# Patient Record
Sex: Female | Born: 1955 | Race: White | Hispanic: No | Marital: Married | Smoking: Former smoker
Health system: Southern US, Community
[De-identification: ages and names within clinical notes are randomized; demographics above are authoritative.]

## PROBLEM LIST (undated history)

## (undated) DIAGNOSIS — J9610 Chronic respiratory failure, unspecified whether with hypoxia or hypercapnia: Secondary | ICD-10-CM

## (undated) DIAGNOSIS — J189 Pneumonia, unspecified organism: Secondary | ICD-10-CM

## (undated) DIAGNOSIS — E559 Vitamin D deficiency, unspecified: Secondary | ICD-10-CM

## (undated) DIAGNOSIS — E785 Hyperlipidemia, unspecified: Secondary | ICD-10-CM

## (undated) DIAGNOSIS — C801 Malignant (primary) neoplasm, unspecified: Secondary | ICD-10-CM

## (undated) DIAGNOSIS — D696 Thrombocytopenia, unspecified: Secondary | ICD-10-CM

## (undated) DIAGNOSIS — R490 Dysphonia: Secondary | ICD-10-CM

## (undated) DIAGNOSIS — J449 Chronic obstructive pulmonary disease, unspecified: Secondary | ICD-10-CM

## (undated) DIAGNOSIS — J42 Unspecified chronic bronchitis: Secondary | ICD-10-CM

## (undated) DIAGNOSIS — K7460 Unspecified cirrhosis of liver: Secondary | ICD-10-CM

## (undated) DIAGNOSIS — J383 Other diseases of vocal cords: Secondary | ICD-10-CM

## (undated) DIAGNOSIS — R0600 Dyspnea, unspecified: Secondary | ICD-10-CM

## (undated) HISTORY — PX: HAMMER TOE SURGERY: SHX385

## (undated) HISTORY — DX: Other diseases of vocal cords: J38.3

## (undated) HISTORY — PX: WISDOM TOOTH EXTRACTION: SHX21

## (undated) HISTORY — DX: Hyperlipidemia, unspecified: E78.5

## (undated) HISTORY — DX: Dysphonia: R49.0

## (undated) HISTORY — DX: Thrombocytopenia, unspecified: D69.6

## (undated) HISTORY — DX: Chronic respiratory failure, unspecified whether with hypoxia or hypercapnia: J96.10

## (undated) HISTORY — DX: Vitamin D deficiency, unspecified: E55.9

## (undated) HISTORY — DX: Unspecified chronic bronchitis: J42

## (undated) HISTORY — DX: Chronic obstructive pulmonary disease, unspecified: J44.9

---

## 1983-11-29 HISTORY — PX: TUBAL LIGATION: SHX77

## 2007-10-15 ENCOUNTER — Encounter: Admission: RE | Admit: 2007-10-15 | Discharge: 2007-10-15 | Payer: Self-pay | Admitting: Family Medicine

## 2007-10-24 DIAGNOSIS — I872 Venous insufficiency (chronic) (peripheral): Secondary | ICD-10-CM | POA: Insufficient documentation

## 2007-11-28 ENCOUNTER — Encounter: Admission: RE | Admit: 2007-11-28 | Discharge: 2007-11-28 | Payer: Self-pay | Admitting: Family Medicine

## 2007-12-05 ENCOUNTER — Encounter: Admission: RE | Admit: 2007-12-05 | Discharge: 2007-12-05 | Payer: Self-pay | Admitting: Family Medicine

## 2008-12-24 ENCOUNTER — Encounter: Admission: RE | Admit: 2008-12-24 | Discharge: 2008-12-24 | Payer: Self-pay | Admitting: Family Medicine

## 2009-06-16 ENCOUNTER — Ambulatory Visit: Payer: Self-pay | Admitting: Internal Medicine

## 2009-06-23 LAB — LACTATE DEHYDROGENASE: LDH: 191 U/L (ref 94–250)

## 2009-06-23 LAB — COMPREHENSIVE METABOLIC PANEL
ALT: 15 U/L (ref 0–35)
AST: 20 U/L (ref 0–37)
BUN: 10 mg/dL (ref 6–23)
CO2: 29 mEq/L (ref 19–32)
Calcium: 9.5 mg/dL (ref 8.4–10.5)
Chloride: 103 mEq/L (ref 96–112)
Creatinine, Ser: 1.01 mg/dL (ref 0.40–1.20)
Total Bilirubin: 1.2 mg/dL (ref 0.3–1.2)

## 2009-06-23 LAB — CBC WITH DIFFERENTIAL/PLATELET
BASO%: 0.8 % (ref 0.0–2.0)
Basophils Absolute: 0 10*3/uL (ref 0.0–0.1)
EOS%: 2.3 % (ref 0.0–7.0)
HCT: 39.8 % (ref 34.8–46.6)
HGB: 13.5 g/dL (ref 11.6–15.9)
LYMPH%: 35.1 % (ref 14.0–49.7)
MCH: 34.1 pg — ABNORMAL HIGH (ref 25.1–34.0)
MCHC: 33.9 g/dL (ref 31.5–36.0)
NEUT%: 56.6 % (ref 38.4–76.8)
Platelets: 108 10*3/uL — ABNORMAL LOW (ref 145–400)

## 2009-08-24 ENCOUNTER — Ambulatory Visit: Payer: Self-pay | Admitting: Internal Medicine

## 2009-08-26 LAB — FERRITIN: Ferritin: 36 ng/mL (ref 10–291)

## 2009-08-26 LAB — CBC WITH DIFFERENTIAL/PLATELET
Basophils Absolute: 0 10*3/uL (ref 0.0–0.1)
Eosinophils Absolute: 0 10*3/uL (ref 0.0–0.5)
MCHC: 35.2 g/dL (ref 31.5–36.0)
MONO#: 0.3 10*3/uL (ref 0.1–0.9)
NEUT#: 1.8 10*3/uL (ref 1.5–6.5)
RBC: 3.91 10*6/uL (ref 3.70–5.45)
RDW: 13 % (ref 11.2–14.5)
WBC: 3.1 10*3/uL — ABNORMAL LOW (ref 3.9–10.3)

## 2009-08-26 LAB — IRON AND TIBC
%SAT: 27 % (ref 20–55)
Iron: 94 ug/dL (ref 42–145)
UIBC: 256 ug/dL

## 2009-08-26 LAB — VITAMIN B12: Vitamin B-12: 243 pg/mL (ref 211–911)

## 2009-09-04 ENCOUNTER — Ambulatory Visit (HOSPITAL_COMMUNITY): Admission: RE | Admit: 2009-09-04 | Discharge: 2009-09-04 | Payer: Self-pay | Admitting: Internal Medicine

## 2009-09-04 ENCOUNTER — Ambulatory Visit: Payer: Self-pay | Admitting: Internal Medicine

## 2009-09-04 ENCOUNTER — Encounter: Payer: Self-pay | Admitting: Internal Medicine

## 2009-09-09 LAB — CBC WITH DIFFERENTIAL/PLATELET
Basophils Absolute: 0 10*3/uL (ref 0.0–0.1)
EOS%: 2.7 % (ref 0.0–7.0)
LYMPH%: 38.2 % (ref 14.0–49.7)
MONO#: 0.3 10*3/uL (ref 0.1–0.9)
MONO%: 6.4 % (ref 0.0–14.0)
NEUT#: 2.1 10*3/uL (ref 1.5–6.5)
NEUT%: 51.8 % (ref 38.4–76.8)
Platelets: 137 10*3/uL — ABNORMAL LOW (ref 145–400)
RDW: 13.3 % (ref 11.2–14.5)
WBC: 4 10*3/uL (ref 3.9–10.3)
lymph#: 1.5 10*3/uL (ref 0.9–3.3)

## 2009-12-07 ENCOUNTER — Ambulatory Visit: Payer: Self-pay | Admitting: Internal Medicine

## 2009-12-09 LAB — CBC WITH DIFFERENTIAL/PLATELET
BASO%: 0.9 % (ref 0.0–2.0)
Basophils Absolute: 0 10*3/uL (ref 0.0–0.1)
LYMPH%: 37.3 % (ref 14.0–49.7)
MCV: 101.6 fL — ABNORMAL HIGH (ref 79.5–101.0)
MONO#: 0.3 10*3/uL (ref 0.1–0.9)
MONO%: 7.1 % (ref 0.0–14.0)
NEUT#: 2.1 10*3/uL (ref 1.5–6.5)
RBC: 4.15 10*6/uL (ref 3.70–5.45)
lymph#: 1.5 10*3/uL (ref 0.9–3.3)

## 2010-01-20 ENCOUNTER — Encounter: Admission: RE | Admit: 2010-01-20 | Discharge: 2010-01-20 | Payer: Self-pay | Admitting: Family Medicine

## 2011-01-20 ENCOUNTER — Other Ambulatory Visit: Payer: Self-pay | Admitting: Family Medicine

## 2011-01-20 DIAGNOSIS — Z1231 Encounter for screening mammogram for malignant neoplasm of breast: Secondary | ICD-10-CM

## 2011-01-20 DIAGNOSIS — N959 Unspecified menopausal and perimenopausal disorder: Secondary | ICD-10-CM

## 2011-01-28 ENCOUNTER — Other Ambulatory Visit: Payer: Self-pay

## 2011-01-28 ENCOUNTER — Ambulatory Visit: Payer: Self-pay

## 2011-02-02 ENCOUNTER — Ambulatory Visit
Admission: RE | Admit: 2011-02-02 | Discharge: 2011-02-02 | Disposition: A | Discharge status: Home / Self Care | Payer: 59 | Source: Ambulatory Visit | Attending: Family Medicine | Admitting: Family Medicine

## 2011-02-02 DIAGNOSIS — Z1231 Encounter for screening mammogram for malignant neoplasm of breast: Secondary | ICD-10-CM

## 2011-02-02 DIAGNOSIS — N959 Unspecified menopausal and perimenopausal disorder: Secondary | ICD-10-CM

## 2011-02-04 ENCOUNTER — Other Ambulatory Visit: Payer: Self-pay | Admitting: Family Medicine

## 2011-02-04 DIAGNOSIS — R928 Other abnormal and inconclusive findings on diagnostic imaging of breast: Secondary | ICD-10-CM

## 2011-02-16 ENCOUNTER — Ambulatory Visit
Admission: RE | Admit: 2011-02-16 | Discharge: 2011-02-16 | Disposition: A | Discharge status: Home / Self Care | Payer: 59 | Source: Ambulatory Visit | Attending: Family Medicine | Admitting: Family Medicine

## 2011-02-16 DIAGNOSIS — R928 Other abnormal and inconclusive findings on diagnostic imaging of breast: Secondary | ICD-10-CM

## 2011-03-03 LAB — DIFFERENTIAL
Basophils Absolute: 0 10*3/uL (ref 0.0–0.1)
Monocytes Relative: 7 % (ref 3–12)
Neutro Abs: 1.8 10*3/uL (ref 1.7–7.7)

## 2011-03-03 LAB — CBC
Hemoglobin: 13.6 g/dL (ref 12.0–15.0)
MCHC: 34 g/dL (ref 30.0–36.0)
RDW: 12.3 % (ref 11.5–15.5)

## 2011-03-03 LAB — CHROMOSOME ANALYSIS, BONE MARROW

## 2011-08-17 DIAGNOSIS — D696 Thrombocytopenia, unspecified: Secondary | ICD-10-CM | POA: Insufficient documentation

## 2011-08-17 DIAGNOSIS — L84 Corns and callosities: Secondary | ICD-10-CM | POA: Insufficient documentation

## 2014-05-29 LAB — PULMONARY FUNCTION TEST

## 2014-07-28 ENCOUNTER — Encounter: Payer: Self-pay | Admitting: Pulmonary Disease

## 2014-07-28 ENCOUNTER — Ambulatory Visit (INDEPENDENT_AMBULATORY_CARE_PROVIDER_SITE_OTHER): Payer: 59 | Admitting: Pulmonary Disease

## 2014-07-28 VITALS — BP 124/72 | HR 104 | Ht 69.0 in | Wt 161.6 lb

## 2014-07-28 DIAGNOSIS — J432 Centrilobular emphysema: Secondary | ICD-10-CM

## 2014-07-28 DIAGNOSIS — J449 Chronic obstructive pulmonary disease, unspecified: Secondary | ICD-10-CM | POA: Insufficient documentation

## 2014-07-28 DIAGNOSIS — J438 Other emphysema: Secondary | ICD-10-CM

## 2014-07-28 DIAGNOSIS — R918 Other nonspecific abnormal finding of lung field: Secondary | ICD-10-CM | POA: Insufficient documentation

## 2014-07-28 DIAGNOSIS — R911 Solitary pulmonary nodule: Secondary | ICD-10-CM

## 2014-07-28 MED ORDER — NICOTINE 10 MG IN INHA: 1.0000 | RESPIRATORY_TRACT | Status: DC | PRN

## 2014-07-28 NOTE — Assessment & Plan Note (Signed)
PET scan will be scheduled
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
## 2014-07-28 NOTE — Patient Instructions (Signed)
You have severe COPD You will qualify for pulmonary rehab PET scan will be scheduled  Trial of nicotrol inhaler

## 2014-07-28 NOTE — Assessment & Plan Note (Addendum)
You have severe COPD You will qualify for pulmonary rehab Trial of nicotrol inhaler - smoking cessation paramount Alternatives including medications were discussed

## 2014-08-07 ENCOUNTER — Ambulatory Visit (HOSPITAL_COMMUNITY): Payer: 59

## 2014-08-21 ENCOUNTER — Ambulatory Visit (HOSPITAL_COMMUNITY)
Admission: RE | Admit: 2014-08-21 | Discharge: 2014-08-21 | Disposition: A | Discharge status: Home / Self Care | Payer: 59 | Source: Ambulatory Visit | Attending: Pulmonary Disease | Admitting: Pulmonary Disease

## 2014-08-21 ENCOUNTER — Other Ambulatory Visit: Payer: Self-pay | Admitting: Pulmonary Disease

## 2014-08-21 DIAGNOSIS — K8020 Calculus of gallbladder without cholecystitis without obstruction: Secondary | ICD-10-CM | POA: Diagnosis not present

## 2014-08-21 DIAGNOSIS — R911 Solitary pulmonary nodule: Secondary | ICD-10-CM | POA: Diagnosis present

## 2014-08-21 DIAGNOSIS — K7460 Unspecified cirrhosis of liver: Secondary | ICD-10-CM | POA: Insufficient documentation

## 2014-08-21 LAB — GLUCOSE, CAPILLARY: Glucose-Capillary: 83 mg/dL (ref 70–99)

## 2014-08-21 MED ORDER — FLUDEOXYGLUCOSE F - 18 (FDG) INJECTION
8.0400 | Freq: Once | INTRAVENOUS | Status: AC | PRN
  Administered 2014-08-21: 8.04 via INTRAVENOUS

## 2014-08-27 ENCOUNTER — Other Ambulatory Visit: Payer: Self-pay | Admitting: Pulmonary Disease

## 2014-08-28 NOTE — Telephone Encounter (Signed)
Pt last had refill on nicotrol inhaler 07/28/14 Last OV 07/28/14 Pending OV 10/27/14 Please advise RA thanks

## 2014-08-29 NOTE — Telephone Encounter (Signed)
Ok to refill 

## 2014-09-15 ENCOUNTER — Telehealth (HOSPITAL_COMMUNITY): Payer: Self-pay

## 2014-09-15 NOTE — Telephone Encounter (Signed)
I have called and left a message with Toni Moore to inquire about participation in Pulmonary Rehab. Patient stated on 09/12/14 that she would call insurance to verify coverage.

## 2014-09-19 ENCOUNTER — Telehealth (HOSPITAL_COMMUNITY): Payer: Self-pay

## 2014-09-19 NOTE — Telephone Encounter (Signed)
I have called and left a message with [place] to inquire about participation in Pulmonary Rehab. Will send letter in mail and follow up.

## 2014-10-03 ENCOUNTER — Encounter (HOSPITAL_COMMUNITY): Payer: Self-pay

## 2014-10-03 ENCOUNTER — Encounter (HOSPITAL_COMMUNITY)
Admission: RE | Admit: 2014-10-03 | Discharge: 2014-10-03 | Disposition: A | Discharge status: Home / Self Care | Payer: 59 | Source: Ambulatory Visit | Attending: Pulmonary Disease | Admitting: Pulmonary Disease

## 2014-10-03 VITALS — BP 132/72 | HR 87 | Resp 18 | Ht 69.25 in | Wt 170.9 lb

## 2014-10-03 DIAGNOSIS — Z23 Encounter for immunization: Secondary | ICD-10-CM | POA: Diagnosis not present

## 2014-10-03 DIAGNOSIS — R911 Solitary pulmonary nodule: Secondary | ICD-10-CM | POA: Insufficient documentation

## 2014-10-03 DIAGNOSIS — Z7951 Long term (current) use of inhaled steroids: Secondary | ICD-10-CM | POA: Insufficient documentation

## 2014-10-03 DIAGNOSIS — J439 Emphysema, unspecified: Secondary | ICD-10-CM | POA: Diagnosis present

## 2014-10-03 DIAGNOSIS — Z951 Presence of aortocoronary bypass graft: Secondary | ICD-10-CM | POA: Insufficient documentation

## 2014-10-03 DIAGNOSIS — Z87891 Personal history of nicotine dependence: Secondary | ICD-10-CM | POA: Diagnosis not present

## 2014-10-03 DIAGNOSIS — J449 Chronic obstructive pulmonary disease, unspecified: Secondary | ICD-10-CM | POA: Insufficient documentation

## 2014-10-03 NOTE — Progress Notes (Signed)
Toni Moore 58 y.o. female Pulmonary Rehab Orientation Note Patient arrived today in [facility] for orientation to Pulmonary Rehab. She ambulated from [employer] with little difficulty. She stated she took her own pace and did not have to stop for a rest break. She does not carry portable oxygen. Per pt, she uses oxygen never. Color good, skin warm and dry. Patient is oriented to time and place. Patient's medical history and medications reviewed. Heart rate is normal, breath sounds clear to auscultation, no wheezes, rales, or rhonchi, diminished in the bases. Grip strength equal, strong. Distal pulses palpable. Patient reports she does take medications as prescribed. Patient states she follows a Regular diet. The patient reports no specific efforts to gain or lose weight. She stated she has gained approximately 10 lbs since quitting smoking 2 months ago. We discussed using sugar free candy instead of food when she is wanting to smoke. She is also using nicotrol as needed.Patient's weight will be monitored closely. Demonstration and practice of PLB using pulse oximeter. Patient able to return demonstration satisfactorily. Safety and hand hygiene in the exercise area reviewed with patient. Patient voices understanding of the information reviewed. Department expectations discussed with patient and achievable goals were set. The patient shows enthusiasm about attending the program and we look forward to working with this nice lady. The patient is scheduled for a 6 min walk test on Thursday 11/12 and to begin exercise on Tuesday 11/17 at 1030.   45 minutes was spent on a variety of activities such as assessment of the patient, obtaining baseline data including height, weight, BMI, and grip strength, verifying medical history, allergies, and current medications, and teaching patient strategies for performing tasks with less respiratory effort with emphasis on pursed lip breathing.

## 2014-10-09 ENCOUNTER — Encounter (HOSPITAL_COMMUNITY)
Admission: RE | Admit: 2014-10-09 | Discharge: 2014-10-09 | Disposition: A | Discharge status: Home / Self Care | Payer: 59 | Source: Ambulatory Visit | Attending: Pulmonary Disease | Admitting: Pulmonary Disease

## 2014-10-09 DIAGNOSIS — J439 Emphysema, unspecified: Secondary | ICD-10-CM | POA: Diagnosis not present

## 2014-10-09 NOTE — Progress Notes (Signed)
Toni Moore completed a Six-Minute Walk Test on 10/09/14 . Toni Moore walked 818 feet with 2 breaks.  The patient's lowest oxygen saturation was 85% , highest heart rate was 111 , and highest blood pressure was 144/80. The patient was on room air to start with but when her oxygen saturation dropped to 85% I placed her on 2 liters of oxygen with a nasal cannula. [name] stated that nothing hindered their walk test.

## 2014-10-13 ENCOUNTER — Telehealth: Payer: Self-pay | Admitting: Pulmonary Disease

## 2014-10-13 DIAGNOSIS — J432 Centrilobular emphysema: Secondary | ICD-10-CM

## 2014-10-13 NOTE — Telephone Encounter (Signed)
lmomtcb x1 

## 2014-10-13 NOTE — Telephone Encounter (Signed)
Called and spoke to [place] at Pulmonary rehab. Cloyde Reams stated pt will at least need 2lpm O2 with exertion at home. Six minute walk is in Epic in note from 11/12. Cloyde Reams stated pt will be at pulmonary rehab on 11/17 and will have more data regarding pt's O2 and will fax or call with results.   RA please advise.

## 2014-10-13 NOTE — Telephone Encounter (Signed)
OK to write for oxygen 2 L while ambulating and sleep Please confirm that patient is okay with this

## 2014-10-14 ENCOUNTER — Encounter (HOSPITAL_COMMUNITY)
Admission: RE | Admit: 2014-10-14 | Discharge: 2014-10-14 | Disposition: A | Discharge status: Home / Self Care | Payer: 59 | Source: Ambulatory Visit | Attending: Pulmonary Disease | Admitting: Pulmonary Disease

## 2014-10-14 DIAGNOSIS — J439 Emphysema, unspecified: Secondary | ICD-10-CM | POA: Diagnosis not present

## 2014-10-14 NOTE — Progress Notes (Signed)
Today, Crystalee exercised at [facility]. Service time was from 10:30am to 12:15pm.  The patient exercised for more than 31 minutes performing aerobic, strengthening, and stretching exercises. Oxygen saturation, heart rate, blood pressure, rate of perceived exertion, and shortness of breath were all monitored before, during, and after exercise. Likisha presented with no problems at today's exercise session.   There was no workload change during today's exercise session.  Pre-exercise vitals: . Weight kg: 77.1 . Liters of O2: ra . SpO2: 95 . HR: 89 . BP: 132/64 . CBG: na  Exercise vitals: . Highest heartrate:  108 . Lowest oxygen saturation: 91 . Highest blood pressure: 150/84 . Liters of 02: 2  Post-exercise vitals: . SpO2: 99 . HR: 95 . BP: 124/62 . Liters of O2: 2 . CBG: na  Dr. Brand Males, Medical Director Dr. Wyline Copas is immediately available during today's Pulmonary Rehab session for Virlee Stroschein on 10/14/14 at 10:30am class time.

## 2014-10-14 NOTE — Telephone Encounter (Signed)
Called and spoke to pt. Informed pt of the recs per RA. Pt verbalized understanding and stated she would wear the O2 with exertion and sleep but refused to wear the O2 at work- [employer], [employer]. Order placed for O2.   LMTCB for Molly.

## 2014-10-14 NOTE — Telephone Encounter (Signed)
Referral Notes     Type Date User   General 10/14/2014 2:41 PM LAW, DAWNE J        Note   Called & spoke with patient & advised that APS should be contacting her to get the 02 set up, pt voiced understanding. However, she requested to speak with a Nurse regarding some concerns & questions about the 02. Pt was transferred to Schedulers to put in a phone message to Triage Dawne J Law      Called and LM for pt to return call, apologized for delay in response.  Pt called at 2:41pm and requested to speak with nurse but message was not "specific" and was confusing and triage was unaware at the time that the patient herself was calling.

## 2014-10-14 NOTE — Telephone Encounter (Signed)
LM for Molly to return call. 

## 2014-10-14 NOTE — Telephone Encounter (Signed)
Patient returning call.  [phone]

## 2014-10-14 NOTE — Telephone Encounter (Signed)
Pl confirm order for O2 has been placed

## 2014-10-14 NOTE — Telephone Encounter (Signed)
Needing to speak to nurse a/b o2 pt says she will not be near a phone until Friday.Hillery Hunter

## 2014-10-14 NOTE — Telephone Encounter (Signed)
Spoke with Molly-Pulmonary Rehab. Calling to talk about 6MWT 10/09/14  Desat recorded at 85% RA Patient was placed on 2 liters and sats recovered to high 80's-low 90s Patient returned today for therapy and was placed on 2 liters during exercise - stayed 91% on 2Liters during exercise Cloyde Reams states that the patient needs to have O2 at home   Please advise Dr Elsworth Soho. Thanks.

## 2014-10-14 NOTE — Telephone Encounter (Signed)
Molly from pulm rehab returning call [phone].Hillery Hunter

## 2014-10-15 NOTE — Telephone Encounter (Signed)
Called and spoke to [place]. Iris is needing the room air sat, exercise sat and recovery sat for the pt's walk test to qualify the pt. The 6MWT was not adequate to qualify the pt.   Called and left message for Cloyde Reams to have her break down the 6MWT to get the standard sats to qualify the pt.

## 2014-10-15 NOTE — Telephone Encounter (Signed)
LMOM TCB x2

## 2014-10-15 NOTE — Telephone Encounter (Signed)
Iris calling stating that pt needs to have her 3 part testing to qualify  Can be reached @ [phone].Hillery Hunter

## 2014-10-16 ENCOUNTER — Encounter (HOSPITAL_COMMUNITY)
Admission: RE | Admit: 2014-10-16 | Discharge: 2014-10-16 | Disposition: A | Discharge status: Home / Self Care | Payer: 59 | Source: Ambulatory Visit | Attending: Pulmonary Disease | Admitting: Pulmonary Disease

## 2014-10-16 DIAGNOSIS — J439 Emphysema, unspecified: Secondary | ICD-10-CM | POA: Diagnosis not present

## 2014-10-16 NOTE — Progress Notes (Addendum)
Today, Toni Moore exercised at [facility]. Service time was from 10:30am to 12:30pm.  The patient exercised for more than 31 minutes performing aerobic, strengthening, and stretching exercises. Oxygen saturation, heart rate, blood pressure, rate of perceived exertion, and shortness of breath were all monitored before, during, and after exercise. Anjali presented with no problems at today's exercise session. The patient attended education class today with Vonita Moss on "Exercise for the Pulmonary Patient".  There was an increase in workload change during today's exercise session.  Pre-exercise vitals: . Weight kg: 77.6 . Liters of O2: ra . SpO2: 93% . HR: 84 . BP: 118/64 . CBG: na  Exercise vitals: . Highest heartrate:  109 . Lowest oxygen saturation: 92 . Highest blood pressure: 100/60 . Liters of 02: 2  Post-exercise vitals: . SpO2: 97 . HR: 90 . BP: 122/70 . Liters of O2: 2 . CBG: na  Dr. Brand Males, Medical Director Dr. Wynelle Cleveland is immediately available during today's Pulmonary Rehab session for Kimberly Nieland on 10/16/14 at 10:30am class time.

## 2014-10-21 ENCOUNTER — Encounter (HOSPITAL_COMMUNITY): Payer: 59

## 2014-10-21 NOTE — Telephone Encounter (Signed)
Called and spoke to [place]. Thayer Headings stated Cloyde Reams is unavailable for the rest of the week. Thayer Headings stated she will try and get another clinician to get the required information to qualify the pt. Thayer Headings stated she will call back and fax what she can (triage fax). Will call back if no returned call.

## 2014-10-22 NOTE — Telephone Encounter (Signed)
Information is in Dr. Bari Mantis box for review.

## 2014-10-22 NOTE — Telephone Encounter (Signed)
Called and spoke to [place]. Thayer Headings stated the walk information was faxed to our office on 10/21/14.   Sharyn Lull, have you seen this?

## 2014-10-23 ENCOUNTER — Encounter (HOSPITAL_COMMUNITY): Payer: 59

## 2014-10-27 ENCOUNTER — Ambulatory Visit (INDEPENDENT_AMBULATORY_CARE_PROVIDER_SITE_OTHER): Payer: 59 | Admitting: Pulmonary Disease

## 2014-10-27 ENCOUNTER — Encounter: Payer: Self-pay | Admitting: Pulmonary Disease

## 2014-10-27 VITALS — BP 128/80 | HR 98 | Ht 69.0 in | Wt 173.4 lb

## 2014-10-27 DIAGNOSIS — R911 Solitary pulmonary nodule: Secondary | ICD-10-CM

## 2014-10-27 DIAGNOSIS — J439 Emphysema, unspecified: Secondary | ICD-10-CM

## 2014-10-27 DIAGNOSIS — Z23 Encounter for immunization: Secondary | ICD-10-CM

## 2014-10-27 NOTE — Patient Instructions (Signed)
Flu shot Oxygen test during sleep CT scan on dec 30 th

## 2014-10-27 NOTE — Telephone Encounter (Signed)
Resolved on OV today chk ONO on RA
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
## 2014-10-28 ENCOUNTER — Encounter (HOSPITAL_COMMUNITY)
Admission: RE | Admit: 2014-10-28 | Discharge: 2014-10-28 | Disposition: A | Discharge status: Home / Self Care | Payer: 59 | Source: Ambulatory Visit | Attending: Pulmonary Disease | Admitting: Pulmonary Disease

## 2014-10-28 DIAGNOSIS — Z23 Encounter for immunization: Secondary | ICD-10-CM | POA: Insufficient documentation

## 2014-10-28 DIAGNOSIS — Z7951 Long term (current) use of inhaled steroids: Secondary | ICD-10-CM | POA: Insufficient documentation

## 2014-10-28 DIAGNOSIS — Z951 Presence of aortocoronary bypass graft: Secondary | ICD-10-CM | POA: Insufficient documentation

## 2014-10-28 DIAGNOSIS — R911 Solitary pulmonary nodule: Secondary | ICD-10-CM | POA: Insufficient documentation

## 2014-10-28 DIAGNOSIS — J439 Emphysema, unspecified: Secondary | ICD-10-CM | POA: Diagnosis not present

## 2014-10-28 DIAGNOSIS — Z87891 Personal history of nicotine dependence: Secondary | ICD-10-CM | POA: Insufficient documentation

## 2014-10-28 DIAGNOSIS — J449 Chronic obstructive pulmonary disease, unspecified: Secondary | ICD-10-CM | POA: Insufficient documentation

## 2014-10-28 NOTE — Assessment & Plan Note (Signed)
Although this is only low-grade hypermetabolic, what is concerning is that they are hypermetabolic in spite of their small size. Would do short-term-3 month follow-up CT scan

## 2014-10-28 NOTE — Telephone Encounter (Signed)
[facility] for Toni Moore at [facility] rehab to inform her of Dr Bari Mantis instructions.

## 2014-10-28 NOTE — Progress Notes (Signed)
Today, Toni Moore exercised at [employer] [facility]. Service time was from 10:30am to 12:30pm.  The patient exercised for more than 31 minutes performing aerobic, strengthening, and stretching exercises. Oxygen saturation, heart rate, blood pressure, rate of perceived exertion, and shortness of breath were all monitored before, during, and after exercise. [name] presented with no problems at today's exercise session.   There was no workload change during today's exercise session.  Pre-exercise vitals: . Weight kg: 77.7 . Liters of O2: 2 . SpO2: 99 . HR: 105 . BP: 122/84 . CBG: na  Exercise vitals: . Highest heartrate:  111 . Lowest oxygen saturation: 88% . Highest blood pressure: 128/80 . Liters of 02: 2  Post-exercise vitals: . SpO2: 99 . HR: 100 . BP: 136/80 . Liters of O2: 2 . CBG: na  Dr. Brand Males, Medical Director Dr. Waldron Labs is immediately available during today's Pulmonary Rehab session for Toni Moore on 10/28/14 at 10:30am class time.

## 2014-10-28 NOTE — Assessment & Plan Note (Signed)
Continue Advair and Spiriva She is agreeable to using nocturnal oxygen if needed but wants to hold off on portable oxygen. We'll proceed with nocturnal oximetry-and provide her with oxygen if significant desaturation I have no doubt that if she has an exacerbation she will end up on oxygen

## 2014-10-29 NOTE — Telephone Encounter (Signed)
lmom for [employer]

## 2014-10-30 ENCOUNTER — Encounter (HOSPITAL_COMMUNITY)
Admission: RE | Admit: 2014-10-30 | Discharge: 2014-10-30 | Disposition: A | Discharge status: Home / Self Care | Payer: 59 | Source: Ambulatory Visit | Attending: Pulmonary Disease | Admitting: Pulmonary Disease

## 2014-10-30 DIAGNOSIS — J439 Emphysema, unspecified: Secondary | ICD-10-CM | POA: Diagnosis not present

## 2014-10-30 NOTE — Progress Notes (Addendum)
Today, Kerianne exercised at [facility] [facility]. Service time was from 1030 to 1225.  The patient exercised for more than 31 minutes performing aerobic, strengthening, and stretching exercises. Oxygen saturation, heart rate, blood pressure, rate of perceived exertion, and shortness of breath were all monitored before, during, and after exercise. Trenia presented with no problems at today's exercise session. Kinzie also attended an education session on pulmonary medications.  There was no workload change during today's exercise session.  Pre-exercise vitals: . Weight kg: 77.7 . Liters of O2: 2L . SpO2: 90 . HR: 90 . BP: 142/60 . CBG: na  Exercise vitals: . Highest heartrate:  105 . Lowest oxygen saturation: 92 . Highest blood pressure: 122/60 . Liters of 02: 2L  Post-exercise vitals: . SpO2: 99 . HR: 82 . BP: 108/60 . Liters of O2: 2L . CBG: na  Dr. Brand Males, Medical Director Dr. Tana Coast  is immediately available during today's Pulmonary Rehab session for Toni Moore on 10/30/2014 at 1030 class time.

## 2014-10-30 NOTE — Progress Notes (Signed)
Toni Moore 58 y.o. female Nutrition Note Spoke with pt. Pt is overweight. There are some ways the pt can make her eating habits healthier. Pt's Rate Your Plate results reviewed with pt. Per discussion, pt states she "likes junk food like hamburgers and hot dogs." Pt reports "I make myself eat vegetables because I know I should eat them." Pt avoids salty food; does not use canned/ convenience food frequently.  Pt "sometimes" adds salt to food. Pt states she eats peanuts or potato chips after she works [employer]) in order to avoid getting leg cramps. Alternative ways to help prevent leg cramps discussed. The role of sodium in lung disease reviewed with pt. Pt expressed understanding of the information reviewed.  Nutrition Diagnosis ? Food-and nutrition-related knowledge deficit related to lack of exposure to information as related to diagnosis of pulmonary disease ?  Nutrition Intervention ? Pt's individual nutrition plan and goals reviewed with pt. ? Benefits of adopting healthy eating habits discussed when pt's Rate Your Plate reviewed. ? Pt to attend the Nutrition and Lung Disease class ? Continual client-centered nutrition education by RD, as part of interdisciplinary care. Goal(s) 1. Describe the benefit of including fruits, vegetables, whole grains, and low-fat dairy products in a healthy meal plan. Monitor and Evaluate progress toward nutrition goal with team.   Derek Mound, M.Ed, RD, LDN, CDE 10/30/2014 12:34 PM

## 2014-10-31 ENCOUNTER — Telehealth: Payer: Self-pay | Admitting: Pulmonary Disease

## 2014-10-31 DIAGNOSIS — J449 Chronic obstructive pulmonary disease, unspecified: Secondary | ICD-10-CM

## 2014-10-31 NOTE — Telephone Encounter (Signed)
Mollly advised. Lankin Bing, CMA

## 2014-10-31 NOTE — Telephone Encounter (Signed)
Called pulm rehab and they are now closed. [facility] Monday AM

## 2014-11-03 NOTE — Telephone Encounter (Signed)
Called and Cloyde Reams is off today, Left a message with Thayer Headings to have her call us back. Ingram Bing, CMA

## 2014-11-04 ENCOUNTER — Encounter (HOSPITAL_COMMUNITY)
Admission: RE | Admit: 2014-11-04 | Discharge: 2014-11-04 | Disposition: A | Discharge status: Home / Self Care | Payer: 59 | Source: Ambulatory Visit | Attending: Pulmonary Disease | Admitting: Pulmonary Disease

## 2014-11-04 DIAGNOSIS — J439 Emphysema, unspecified: Secondary | ICD-10-CM | POA: Diagnosis not present

## 2014-11-04 NOTE — Telephone Encounter (Signed)
lmtcb for Molly.  

## 2014-11-04 NOTE — Progress Notes (Signed)
Today, Toni Moore exercised at [employer] [facility]. Service time was from 1030 to 1200.  The patient exercised for more than 31 minutes performing aerobic, strengthening, and stretching exercises. Oxygen saturation, heart rate, blood pressure, rate of perceived exertion, and shortness of breath were all monitored before, during, and after exercise. Forrestine presented with no problems at today's exercise session.   There was a workload change during today's exercise session.  Pre-exercise vitals: . Weight kg: 78.4 . Liters of O2: 2L . SpO2: 100 . HR: 95 . BP: 140/74 . CBG: na  Exercise vitals: . Highest heartrate:  109 . Lowest oxygen saturation: 93 . Highest blood pressure: 150/74 . Liters of 02: 2L  Post-exercise vitals: . SpO2: 100 . HR:  . BP: 97 . Liters of O2: 114/60 . CBG: na  Dr. Brand Males, Medical Director Dr. Tana Coast is immediately available during today's Pulmonary Rehab session for Taquita Demby on 11/04/2014 at 1030 class time.

## 2014-11-04 NOTE — Telephone Encounter (Signed)
Molly returning call.Hillery Hunter

## 2014-11-05 NOTE — Telephone Encounter (Signed)
Spoke with Thayer Headings at [employer] is not there today and she is unaware of what is needed for patient. Will have Molly call us back once back tomorrow.

## 2014-11-06 ENCOUNTER — Encounter (HOSPITAL_COMMUNITY)
Admission: RE | Admit: 2014-11-06 | Discharge: 2014-11-06 | Disposition: A | Discharge status: Home / Self Care | Payer: 59 | Source: Ambulatory Visit | Attending: Pulmonary Disease | Admitting: Pulmonary Disease

## 2014-11-06 DIAGNOSIS — J439 Emphysema, unspecified: Secondary | ICD-10-CM | POA: Diagnosis not present

## 2014-11-06 NOTE — Telephone Encounter (Signed)
[phone] returning call

## 2014-11-06 NOTE — Telephone Encounter (Signed)
Spoke to [place] at [employer] and she states pt had desat to 85% on 3 rd minute of 6MW.  Results placed in RA look at.  She wants to know if we think pt needs oxygen and if so it needs to be very portable due to pt being very active with work.  Also Lm for pt to see if she had ono done and by who so we can get results.  Please advise

## 2014-11-06 NOTE — Telephone Encounter (Signed)
Called spoke with pt. She reports she has not had ONO done yet. She is going to contact DME when it is a good time for her to have this done. Results for 6MW in RA's look at. Please advise thanks

## 2014-11-06 NOTE — Telephone Encounter (Signed)
Pt did not want portable O2 SO they can use O2 during rehab, as needed Pl FU on ONO - or order one if needed

## 2014-11-06 NOTE — Progress Notes (Signed)
Today, Anishka exercised at [facility]. Service time was from 10:30am to 12:20pm.  The patient exercised for more than 31 minutes performing aerobic, strengthening, and stretching exercises. Oxygen saturation, heart rate, blood pressure, rate of perceived exertion, and shortness of breath were all monitored before, during, and after exercise. Renuka presented with no problems at today's exercise session. The patient attended education class today with Jeanella Craze on [employer].   There was no workload change during today's exercise session.  Pre-exercise vitals: . Weight kg: 79.5 . Liters of O2: 2 . SpO2: 98 . HR: 76 . BP: 120/70 . CBG: na  Exercise vitals: . Highest heartrate:  99 . Lowest oxygen saturation: 98 . Highest blood pressure: 148/92 . Liters of 02: 2  Post-exercise vitals: . SpO2: 99 . HR: 88 . BP: 148/92 . Liters of O2: 2 . CBG: na  Dr. Brand Males, Medical Director Dr. Coralyn Pear is immediately available during today's Pulmonary Rehab session for Gladys Deckard on 11/06/14 at 10:30am class time.

## 2014-11-06 NOTE — Telephone Encounter (Signed)
ATC molly with pulm rehab but now closed. WCB in AM

## 2014-11-10 NOTE — Telephone Encounter (Signed)
LMTCB for Molly. [place] Bing, CMA

## 2014-11-11 ENCOUNTER — Encounter (HOSPITAL_COMMUNITY)
Admission: RE | Admit: 2014-11-11 | Discharge: 2014-11-11 | Disposition: A | Discharge status: Home / Self Care | Payer: 59 | Source: Ambulatory Visit | Attending: Pulmonary Disease | Admitting: Pulmonary Disease

## 2014-11-11 DIAGNOSIS — J439 Emphysema, unspecified: Secondary | ICD-10-CM | POA: Diagnosis not present

## 2014-11-11 NOTE — Progress Notes (Signed)
Today, Toni Moore exercised at [facility]. Service time was from 1030 to 1215.  The patient exercised for more than 31 minutes performing aerobic, strengthening, and stretching exercises. Oxygen saturation, heart rate, blood pressure, rate of perceived exertion, and shortness of breath were all monitored before, during, and after exercise. [name] presented with no problems at today's exercise session.   There was no workload change during today's exercise session.  Pre-exercise vitals: . Weight kg: 78.5 . Liters of O2: 2L . SpO2: 98 . HR: 92 . BP: 126/62 . CBG: na  Exercise vitals: . Highest heartrate:  107 . Lowest oxygen saturation: 90 . Highest blood pressure: 154/72 . Liters of 02: 2L  Post-exercise vitals: . SpO2: 98 . HR: 92 . BP: 100/60 . Liters of O2: 2L . CBG: na  Dr. Brand Males, Medical Director Dr. Coralyn Pear is immediately available during today's Pulmonary Rehab session for Toni Moore on 11/11/2014 at 1030 class time.

## 2014-11-11 NOTE — Telephone Encounter (Signed)
lmtcb for Molly.  

## 2014-11-12 NOTE — Telephone Encounter (Signed)
LMTCB with Thayer Headings and she will pass the message on to [place]. [place] Bing, CMA

## 2014-11-13 ENCOUNTER — Encounter (HOSPITAL_COMMUNITY)
Admission: RE | Admit: 2014-11-13 | Discharge: 2014-11-13 | Disposition: A | Discharge status: Home / Self Care | Payer: 59 | Source: Ambulatory Visit | Attending: Pulmonary Disease | Admitting: Pulmonary Disease

## 2014-11-13 DIAGNOSIS — J439 Emphysema, unspecified: Secondary | ICD-10-CM | POA: Diagnosis not present

## 2014-11-13 NOTE — Telephone Encounter (Signed)
Spoke with Toni Moore and notified of recs per RA  She verbalized understanding and nothing further needed  Looks like ONO was never ordered  Will send order to [facility]

## 2014-11-13 NOTE — Progress Notes (Signed)
Today, Toni Moore exercised at [facility]. Service time was from 10:30am to 12:10pm.  The patient exercised for more than 31 minutes performing aerobic, strengthening, and stretching exercises. Oxygen saturation, heart rate, blood pressure, rate of perceived exertion, and shortness of breath were all monitored before, during, and after exercise. Toni Moore presented with no problems at today's exercise session. Patient attended education class on Pursed Lip and Diaphragmatic breathing with Toni Moore.  There was an increase in workload change during today's exercise session.  Pre-exercise vitals: . Weight kg: 78.5 . Liters of O2: 2 . SpO2: 98 . HR: 84 . BP: 120/80 . CBG: na  Exercise vitals: . Highest heartrate:  103 . Lowest oxygen saturation: 91 . Highest blood pressure: 132/64 . Liters of 02: 2  Post-exercise vitals: . SpO2: 100 . HR: 80 . BP: 100/64 . Liters of O2: 2 . CBG: na  Dr. Brand Males, Medical Director Dr. Tana Coast is immediately available during today's Pulmonary Rehab session for Toni Moore on 11/13/14 at 10:30am class time.

## 2014-11-18 ENCOUNTER — Encounter (HOSPITAL_COMMUNITY)
Admission: RE | Admit: 2014-11-18 | Discharge: 2014-11-18 | Disposition: A | Discharge status: Home / Self Care | Payer: 59 | Source: Ambulatory Visit | Attending: Pulmonary Disease | Admitting: Pulmonary Disease

## 2014-11-18 DIAGNOSIS — J439 Emphysema, unspecified: Secondary | ICD-10-CM | POA: Diagnosis not present

## 2014-11-18 NOTE — Progress Notes (Signed)
Today, Toni Moore exercised at [facility]. Service time was from 1030 to 1200.  The patient exercised for more than 31 minutes performing aerobic, strengthening, and stretching exercises. Oxygen saturation, heart rate, blood pressure, rate of perceived exertion, and shortness of breath were all monitored before, during, and after exercise. Jazmon presented with no problems at today's exercise session.   There was no workload change during today's exercise session.  Pre-exercise vitals: . Weight kg: 78.8 . Liters of O2: 2L . SpO2: 100 . HR: 87 . BP: 108/82 . CBG: na  Exercise vitals: . Highest heartrate:  105 . Lowest oxygen saturation: 94 . Highest blood pressure: 120/60 . Liters of 02: 2L  Post-exercise vitals: . SpO2: 99 . HR: 96 . BP: 126/64 . Liters of O2: 2L . CBG: na  Dr. Brand Males, Medical Director Dr. Tana Coast is immediately available during today's Pulmonary Rehab session for Yareli Carthen on 11/18/2014 at 1030 class time.

## 2014-11-20 ENCOUNTER — Encounter (HOSPITAL_COMMUNITY): Payer: 59

## 2014-11-25 ENCOUNTER — Encounter (HOSPITAL_COMMUNITY)
Admission: RE | Admit: 2014-11-25 | Discharge: 2014-11-25 | Disposition: A | Discharge status: Home / Self Care | Payer: 59 | Source: Ambulatory Visit | Attending: Pulmonary Disease | Admitting: Pulmonary Disease

## 2014-11-25 DIAGNOSIS — J439 Emphysema, unspecified: Secondary | ICD-10-CM | POA: Diagnosis not present

## 2014-11-25 NOTE — Progress Notes (Signed)
Today, Toni Moore exercised at [facility]. Service time was from 10:30 to 11:50pm.  The patient exercised for more than 31 minutes performing aerobic, strengthening, and stretching exercises. Oxygen saturation, heart rate, blood pressure, rate of perceived exertion, and shortness of breath were all monitored before, during, and after exercise. [name] presented with no problems at today's exercise session.   There was an increase in workload change during today's exercise session.  Pre-exercise vitals: . Weight kg: 79.1 . Liters of O2: 2 . SpO2: 100 . HR: 76 . BP: 132/80 . CBG: na  Exercise vitals: . Highest heartrate:  104 . Lowest oxygen saturation: 90% . Highest blood pressure: 150/68 . Liters of 02: 2  Post-exercise vitals: . SpO2: 97 . HR: 93 . BP: 110/72 . Liters of O2: 2 . CBG: na  Dr. Brand Males, Medical Director Dr. Candiss Norse is immediately available during today's Pulmonary Rehab session for Toni Moore on 11/25/14 at 10:30am class time.

## 2014-11-26 ENCOUNTER — Ambulatory Visit (INDEPENDENT_AMBULATORY_CARE_PROVIDER_SITE_OTHER)
Admission: RE | Admit: 2014-11-26 | Discharge: 2014-11-26 | Disposition: A | Discharge status: Home / Self Care | Payer: 59 | Source: Ambulatory Visit | Attending: Pulmonary Disease | Admitting: Pulmonary Disease

## 2014-11-26 DIAGNOSIS — R911 Solitary pulmonary nodule: Secondary | ICD-10-CM

## 2014-11-27 ENCOUNTER — Encounter (HOSPITAL_COMMUNITY)
Admission: RE | Admit: 2014-11-27 | Discharge: 2014-11-27 | Disposition: A | Discharge status: Home / Self Care | Payer: 59 | Source: Ambulatory Visit | Attending: Pulmonary Disease | Admitting: Pulmonary Disease

## 2014-11-27 DIAGNOSIS — J439 Emphysema, unspecified: Secondary | ICD-10-CM | POA: Diagnosis not present

## 2014-11-27 NOTE — Progress Notes (Signed)
Today, Toni Moore exercised at [facility]. Service time was from 10:30am to 12:30pm.  The patient exercised for more than 31 minutes performing aerobic, strengthening, and stretching exercises. Oxygen saturation, heart rate, blood pressure, rate of perceived exertion, and shortness of breath were all monitored before, during, and after exercise. [name] presented with no problems at today's exercise session. Patient attended Warning Signs and Symptoms education with Rosebud Poles.   There was no workload change during today's exercise session.  Pre-exercise vitals: . Weight kg: 78.7 . Liters of O2: 2 . SpO2: 99 . HR: 90 . BP: 104/68 . CBG: na  Exercise vitals: . Highest heartrate:  116 . Lowest oxygen saturation: 94 . Highest blood pressure: 150/80 . Liters of 02: 2  Post-exercise vitals: . SpO2: 98 . HR: 92 . BP: 100/60 . Liters of O2: 2 . CBG: na  Dr. Brand Males, Medical Director Dr. Candiss Norse is immediately available during today's Pulmonary Rehab session for Toni Moore on 11/27/14 at 10:30am class time.

## 2014-12-02 ENCOUNTER — Encounter (HOSPITAL_COMMUNITY)
Admission: RE | Admit: 2014-12-02 | Discharge: 2014-12-02 | Disposition: A | Discharge status: Home / Self Care | Payer: BLUE CROSS/BLUE SHIELD | Source: Ambulatory Visit | Attending: Pulmonary Disease | Admitting: Pulmonary Disease

## 2014-12-02 DIAGNOSIS — J439 Emphysema, unspecified: Secondary | ICD-10-CM | POA: Insufficient documentation

## 2014-12-02 DIAGNOSIS — Z951 Presence of aortocoronary bypass graft: Secondary | ICD-10-CM | POA: Insufficient documentation

## 2014-12-02 DIAGNOSIS — J449 Chronic obstructive pulmonary disease, unspecified: Secondary | ICD-10-CM | POA: Insufficient documentation

## 2014-12-02 DIAGNOSIS — Z7951 Long term (current) use of inhaled steroids: Secondary | ICD-10-CM | POA: Diagnosis not present

## 2014-12-02 DIAGNOSIS — R911 Solitary pulmonary nodule: Secondary | ICD-10-CM | POA: Insufficient documentation

## 2014-12-02 DIAGNOSIS — Z87891 Personal history of nicotine dependence: Secondary | ICD-10-CM | POA: Insufficient documentation

## 2014-12-02 DIAGNOSIS — Z23 Encounter for immunization: Secondary | ICD-10-CM | POA: Diagnosis not present

## 2014-12-02 NOTE — Progress Notes (Signed)
I did not conduct a Home Exercise Prescription with Toni Moore due to her refusal to use oxygen at home.  The patient desaturated to 85% on room air during her 6 minute walk test. [name] states that when she is home and utilizes her pulse oximeter the lowest she has ever seen is 88% on room air. Patient states that she is not ready to use oxygen at home and Pulmonary Rehab is the only place that she will use it. The patient was educated on the dangers of low oxygen saturations over time. The patient currently utilizes 2 liters at Pulmonary Rehab and has desaturated as low as 88% while walking on the treadmill.

## 2014-12-02 NOTE — Progress Notes (Signed)
Today, [name] exercised at [facility]. Service time was from 10:30am to 12:05pm.  The patient exercised for more than 31 minutes performing aerobic, strengthening, and stretching exercises. Oxygen saturation, heart rate, blood pressure, rate of perceived exertion, and shortness of breath were all monitored before, during, and after exercise. [name] presented with no problems at today's exercise session.   There was an increase in workload change during today's exercise session.  Pre-exercise vitals: . Weight kg: 79.1 . Liters of O2: 2 . SpO2: 100 . HR: 93 . BP: 124/84 . CBG: na  Exercise vitals: . Highest heartrate:  112 . Lowest oxygen saturation: 92 . Highest blood pressure: 138/70 . Liters of 02: 2  Post-exercise vitals: . SpO2: 99 . HR: 90 . BP: 118/70 . Liters of O2: 2 . CBG: na  Dr. Brand Males, Medical Director Dr. Coralyn Pear is immediately available during today's Pulmonary Rehab session for Toni Moore on 12/02/14 at 10:30am class time.

## 2014-12-04 ENCOUNTER — Telehealth: Payer: Self-pay | Admitting: Pulmonary Disease

## 2014-12-04 ENCOUNTER — Encounter (HOSPITAL_COMMUNITY): Payer: BLUE CROSS/BLUE SHIELD

## 2014-12-04 NOTE — Telephone Encounter (Signed)
lmtcb 12/04/14

## 2014-12-04 NOTE — Telephone Encounter (Signed)
Pulse Oximetry report shows mild desaturation every 1.5 h during sleep.  Suggest 2L oxygen during sleep.

## 2014-12-05 NOTE — Telephone Encounter (Signed)
lmtcb 12/05/14

## 2014-12-08 NOTE — Telephone Encounter (Signed)
Spoke to patient, she says that she needs to discuss this with her husband before she will allow [place] to send order for Oxygen.  Patient will call me back to let me know if I can order the O2.  Pt needs 2L oxygen at night due to ONO study. Awaiting call back.

## 2014-12-09 ENCOUNTER — Encounter (HOSPITAL_COMMUNITY)
Admission: RE | Admit: 2014-12-09 | Discharge: 2014-12-09 | Disposition: A | Discharge status: Home / Self Care | Payer: BLUE CROSS/BLUE SHIELD | Source: Ambulatory Visit | Attending: Pulmonary Disease | Admitting: Pulmonary Disease

## 2014-12-09 DIAGNOSIS — J439 Emphysema, unspecified: Secondary | ICD-10-CM | POA: Diagnosis not present

## 2014-12-09 NOTE — Progress Notes (Signed)
Today, Angellynn exercised at [facility]. Service time was from 1030 to 1155.  The patient exercised for more than 31 minutes performing aerobic, strengthening, and stretching exercises. Oxygen saturation, heart rate, blood pressure, rate of perceived exertion, and shortness of breath were all monitored before, during, and after exercise. Lakiah presented with no problems at today's exercise session.   There was no workload change during today's exercise session.  Pre-exercise vitals: . Weight kg: 78.7 . Liters of O2: 2 . SpO2: 97 . HR: 105 . BP: 136/80 . CBG: na  Exercise vitals: . Highest heartrate:  121 . Lowest oxygen saturation: 95 . Highest blood pressure: 158/80 . Liters of 02: 2  Post-exercise vitals: . SpO2: 98 . HR: 103 . BP: 110/66 . Liters of O2: 2 . CBG: na Dr. Brand Males, Medical Director Dr. Coralyn Pear is immediately available during today's Pulmonary Rehab session for Jeily Guthridge on 12/09/2014 at 1030 class time.  Marland Kitchen

## 2014-12-10 NOTE — Telephone Encounter (Signed)
Per Dr. Elsworth Soho, close encounter... If patient chooses to go on Oxygen will await her request.  Encounter closed.

## 2014-12-11 ENCOUNTER — Encounter (HOSPITAL_COMMUNITY)
Admission: RE | Admit: 2014-12-11 | Discharge: 2014-12-11 | Disposition: A | Discharge status: Home / Self Care | Payer: BLUE CROSS/BLUE SHIELD | Source: Ambulatory Visit | Attending: Pulmonary Disease | Admitting: Pulmonary Disease

## 2014-12-11 DIAGNOSIS — J439 Emphysema, unspecified: Secondary | ICD-10-CM | POA: Diagnosis not present

## 2014-12-11 NOTE — Progress Notes (Signed)
Today, Toni Moore exercised at [facility]. Service time was from 11:00am to 1:00pm.  The patient exercised for more than 31 minutes performing aerobic, strengthening, and stretching exercises. Oxygen saturation, heart rate, blood pressure, rate of perceived exertion, and shortness of breath were all monitored before, during, and after exercise. [name] presented with no problems at today's exercise session. The patient attended education class today with Dr. Nelda Marseille.  There was no workload change during today's exercise session.  Pre-exercise vitals: . Weight kg: 79.2 . Liters of O2: 2 . SpO2: 100 . HR: 82 . BP: 118/74 . CBG: na  Exercise vitals: . Highest heartrate:  131 . Lowest oxygen saturation: 92 . Highest blood pressure: 136/80 . Liters of 02: 2  Post-exercise vitals: . SpO2: 100 . HR: 78 . BP: 116/58 . Liters of O2: 2 . CBG: na  Dr. Brand Males, Medical Director Dr. Algis Liming is immediately available during today's Pulmonary Rehab session for Eleonore Shippee on 12/11/14 at 10:30am class time.

## 2014-12-16 ENCOUNTER — Encounter (HOSPITAL_COMMUNITY)
Admission: RE | Admit: 2014-12-16 | Discharge: 2014-12-16 | Disposition: A | Discharge status: Home / Self Care | Payer: BLUE CROSS/BLUE SHIELD | Source: Ambulatory Visit | Attending: Pulmonary Disease | Admitting: Pulmonary Disease

## 2014-12-16 DIAGNOSIS — J439 Emphysema, unspecified: Secondary | ICD-10-CM | POA: Diagnosis not present

## 2014-12-16 NOTE — Progress Notes (Signed)
Today, [name] exercised at [facility]. Service time was from 1030 to 1205.  The patient exercised for more than 31 minutes performing aerobic, strengthening, and stretching exercises. Oxygen saturation, heart rate, blood pressure, rate of perceived exertion, and shortness of breath were all monitored before, during, and after exercise. [name] presented with no problems at today's exercise session.   There was no workload change during today's exercise session.  Pre-exercise vitals: . Weight kg: 79.7 . Liters of O2: 2 . SpO2: 97 . HR: 96 . BP: 124/80 . CBG: na  Exercise vitals: . Highest heartrate:  114 . Lowest oxygen saturation: 94 . Highest blood pressure: 152/70 . Liters of 02: 2  Post-exercise vitals: . SpO2: 100 . HR: 102 . BP: 110/70 . Liters of O2: 2 . CBG: na Dr. Brand Males, Medical Director Dr. Algis Liming is immediately available during today's Pulmonary Rehab session for Toni Moore on 12/16/2014 at 1030 class time.  Marland Kitchen

## 2014-12-18 ENCOUNTER — Encounter (HOSPITAL_COMMUNITY)
Admission: RE | Admit: 2014-12-18 | Discharge: 2014-12-18 | Disposition: A | Discharge status: Home / Self Care | Payer: BLUE CROSS/BLUE SHIELD | Source: Ambulatory Visit | Attending: Pulmonary Disease | Admitting: Pulmonary Disease

## 2014-12-18 DIAGNOSIS — J439 Emphysema, unspecified: Secondary | ICD-10-CM | POA: Diagnosis not present

## 2014-12-18 NOTE — Progress Notes (Signed)
Today, Toni Moore exercised at [facility]. Service time was from 10:30am to 12:30pm.  The patient exercised for more than 31 minutes performing aerobic, strengthening, and stretching exercises. Oxygen saturation, heart rate, blood pressure, rate of perceived exertion, and shortness of breath were all monitored before, during, and after exercise. Toni Moore presented with no problems at today's exercise session. The patient attended education today with Trish Fountain on "Oxygen Use and Safety."  There was an increase workload change during today's exercise session.  Pre-exercise vitals: . Weight kg: 79.4 . Liters of O2: 2 . SpO2: 100 . HR: 82 . BP: 116/70 . CBG: na  Exercise vitals: . Highest heartrate:  109 . Lowest oxygen saturation: 92 . Highest blood pressure: 142/80 . Liters of 02: 2  Post-exercise vitals: . SpO2: 99 . HR: 86 . BP: 126/72 . Liters of O2: 2 . CBG: na  Dr. Brand Males, Medical Director Dr. Daleen Bo is immediately available during today's Pulmonary Rehab session for Toni Moore on 12/18/14 at 10:30am class time.

## 2014-12-23 ENCOUNTER — Encounter (HOSPITAL_COMMUNITY)
Admission: RE | Admit: 2014-12-23 | Discharge: 2014-12-23 | Disposition: A | Discharge status: Home / Self Care | Payer: BLUE CROSS/BLUE SHIELD | Source: Ambulatory Visit | Attending: Pulmonary Disease | Admitting: Pulmonary Disease

## 2014-12-23 DIAGNOSIS — J439 Emphysema, unspecified: Secondary | ICD-10-CM | POA: Diagnosis not present

## 2014-12-23 NOTE — Progress Notes (Signed)
Today, Toni Moore exercised at [facility]. Service time was from 1030 to 1200.  The patient exercised for more than 31 minutes performing aerobic, strengthening, and stretching exercises. Oxygen saturation, heart rate, blood pressure, rate of perceived exertion, and shortness of breath were all monitored before, during, and after exercise. Orean presented with no problems at today's exercise session.   There was no workload change during today's exercise session.  Pre-exercise vitals: . Weight kg: 79.3 . Liters of O2: 2 . SpO2: 99 . HR: 102 . BP: 110/60 . CBG: na  Exercise vitals: . Highest heartrate:  115 . Lowest oxygen saturation: 94 . Highest blood pressure: 124/68 . Liters of 02: 2  Post-exercise vitals: . SpO2: 98 . HR: 101 . BP: 108/62 . Liters of O2: 2 . CBG: na Dr. Brand Males, Medical Director Dr. Tana Coast is immediately available during today's Pulmonary Rehab session for Pauletta Pickney on 12/23/2014 at 1030 class time.  Marland Kitchen

## 2014-12-24 ENCOUNTER — Ambulatory Visit (INDEPENDENT_AMBULATORY_CARE_PROVIDER_SITE_OTHER): Payer: BLUE CROSS/BLUE SHIELD | Admitting: Pulmonary Disease

## 2014-12-24 ENCOUNTER — Encounter: Payer: Self-pay | Admitting: Pulmonary Disease

## 2014-12-24 VITALS — BP 124/70 | HR 88 | Ht 69.0 in | Wt 182.2 lb

## 2014-12-24 DIAGNOSIS — R911 Solitary pulmonary nodule: Secondary | ICD-10-CM

## 2014-12-24 DIAGNOSIS — J441 Chronic obstructive pulmonary disease with (acute) exacerbation: Secondary | ICD-10-CM

## 2014-12-24 DIAGNOSIS — J449 Chronic obstructive pulmonary disease, unspecified: Secondary | ICD-10-CM

## 2014-12-24 NOTE — Assessment & Plan Note (Signed)
Decrease in size of nodules favors inflammatory etiology. We'll obtain follow-up CT scan in June 2016
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
## 2014-12-24 NOTE — Assessment & Plan Note (Signed)
Continue Advair and Spiriva We discussed benefits of pulmonary rehabilitation We again had a long discussion about oxygen and its benefits for COPD, and symptoms of cor pulmonale. We will prescribe oxygen 2 L during sleep-she will discuss with DME, she has concerns about the concentrator making noise-if acceptable she will start on this, and we will repeat an nocturnal oximetry

## 2014-12-24 NOTE — Patient Instructions (Addendum)
Trial of 2L o2 during sleep  -clarify with DME about noise with O2 concentrator Call me back in 2 weeks to report about Oxygen Stay on advair / spiriva Ct chest in June 2016

## 2014-12-25 ENCOUNTER — Encounter (HOSPITAL_COMMUNITY)
Admission: RE | Admit: 2014-12-25 | Discharge: 2014-12-25 | Disposition: A | Discharge status: Home / Self Care | Payer: BLUE CROSS/BLUE SHIELD | Source: Ambulatory Visit | Attending: Pulmonary Disease | Admitting: Pulmonary Disease

## 2014-12-25 DIAGNOSIS — J439 Emphysema, unspecified: Secondary | ICD-10-CM | POA: Diagnosis not present

## 2014-12-25 NOTE — Progress Notes (Signed)
Today, Toni Moore exercised at [facility]. Service time was from 10:30a to 12:30p.  The patient exercised for more than 31 minutes performing aerobic, strengthening, and stretching exercises. Oxygen saturation, heart rate, blood pressure, rate of perceived exertion, and shortness of breath were all monitored before, during, and after exercise. Toni Moore presented with no problems at today's exercise session. The patient attended education today with Jeanella Craze on [employer].  There was no workload change during today's exercise session.  Pre-exercise vitals: . Weight kg: 80.5 . Liters of O2: 2 . SpO2: 100 . HR: 80 . BP: 126/80 . CBG: na  Exercise vitals: . Highest heartrate:  103 . Lowest oxygen saturation: 97 . Highest blood pressure: 140/80 . Liters of 02: 2  Post-exercise vitals: . SpO2: 98 . HR: 85 . BP: 126/82 . Liters of O2: 2 . CBG: na  Dr. Brand Males, Medical Director Dr. Tana Coast is immediately available during today's Pulmonary Rehab session for Toni Moore on 12/25/14 at 10:30a class time.

## 2014-12-30 ENCOUNTER — Encounter (HOSPITAL_COMMUNITY)
Admission: RE | Admit: 2014-12-30 | Discharge: 2014-12-30 | Disposition: A | Discharge status: Home / Self Care | Payer: BLUE CROSS/BLUE SHIELD | Source: Ambulatory Visit | Attending: Pulmonary Disease | Admitting: Pulmonary Disease

## 2014-12-30 DIAGNOSIS — J449 Chronic obstructive pulmonary disease, unspecified: Secondary | ICD-10-CM | POA: Diagnosis not present

## 2014-12-30 DIAGNOSIS — Z87891 Personal history of nicotine dependence: Secondary | ICD-10-CM | POA: Insufficient documentation

## 2014-12-30 DIAGNOSIS — Z7951 Long term (current) use of inhaled steroids: Secondary | ICD-10-CM | POA: Diagnosis not present

## 2014-12-30 DIAGNOSIS — Z23 Encounter for immunization: Secondary | ICD-10-CM | POA: Diagnosis not present

## 2014-12-30 DIAGNOSIS — R911 Solitary pulmonary nodule: Secondary | ICD-10-CM | POA: Insufficient documentation

## 2014-12-30 DIAGNOSIS — Z951 Presence of aortocoronary bypass graft: Secondary | ICD-10-CM | POA: Diagnosis not present

## 2014-12-30 DIAGNOSIS — J439 Emphysema, unspecified: Secondary | ICD-10-CM | POA: Insufficient documentation

## 2014-12-30 NOTE — Progress Notes (Signed)
Today, Toni Moore exercised at [facility]. Service time was from 10:30a to 12:00p.  The patient exercised for more than 31 minutes performing aerobic, strengthening, and stretching exercises. Oxygen saturation, heart rate, blood pressure, rate of perceived exertion, and shortness of breath were all monitored before, during, and after exercise. Liisa presented with no problems at today's exercise session.   There was an increase in workload change during today's exercise session.  Pre-exercise vitals: . Weight kg: 80.1 . Liters of O2: 2 . SpO2: 99 . HR: 89 . BP: 128/76 . CBG: na  Exercise vitals: . Highest heartrate:  101 . Lowest oxygen saturation: 90 . Highest blood pressure: 132/68 . Liters of 02: 2  Post-exercise vitals: . SpO2: 99 . HR: 96 . BP: 132/70 . Liters of O2: 2 . CBG: na  Dr. Brand Males, Medical Director Dr. Tana Coast is immediately available during today's Pulmonary Rehab session for Shannah Conteh on 12/30/14 at 10:30am class time.

## 2015-01-01 ENCOUNTER — Encounter (HOSPITAL_COMMUNITY)
Admission: RE | Admit: 2015-01-01 | Discharge: 2015-01-01 | Disposition: A | Discharge status: Home / Self Care | Payer: BLUE CROSS/BLUE SHIELD | Source: Ambulatory Visit | Attending: Pulmonary Disease | Admitting: Pulmonary Disease

## 2015-01-01 DIAGNOSIS — J439 Emphysema, unspecified: Secondary | ICD-10-CM | POA: Diagnosis not present

## 2015-01-01 NOTE — Progress Notes (Signed)
Today, Ferrin exercised at [facility]. Service time was from 10:30am to 12:15pm.  The patient exercised for more than 31 minutes performing aerobic, strengthening, and stretching exercises. Oxygen saturation, heart rate, blood pressure, rate of perceived exertion, and shortness of breath were all monitored before, during, and after exercise. Toni Moore presented with no problems at today's exercise session. The patient attended education today with Respiratory Therapy.  There was no workload change during today's exercise session.  Pre-exercise vitals: . Weight kg: 80.9 . Liters of O2: 2 . SpO2: 94 . HR: 86 . BP: 128/60 . CBG: na  Exercise vitals: . Highest heartrate:  104 . Lowest oxygen saturation: 93 . Highest blood pressure: 134/80 . Liters of 02: 2  Post-exercise vitals: . SpO2: 98 . HR: 86 . BP: 122/72 . Liters of O2: 2 . CBG: na  Dr. Brand Males, Medical Director Dr. Wyline Copas is immediately available during today's Pulmonary Rehab session for Teaira Croft on 01/01/15 at 10:30am class time.

## 2015-01-05 ENCOUNTER — Other Ambulatory Visit: Payer: Self-pay | Admitting: *Deleted

## 2015-01-05 DIAGNOSIS — R911 Solitary pulmonary nodule: Secondary | ICD-10-CM

## 2015-01-06 ENCOUNTER — Encounter (HOSPITAL_COMMUNITY)
Admission: RE | Admit: 2015-01-06 | Discharge: 2015-01-06 | Disposition: A | Discharge status: Home / Self Care | Payer: BLUE CROSS/BLUE SHIELD | Source: Ambulatory Visit | Attending: Pulmonary Disease | Admitting: Pulmonary Disease

## 2015-01-06 DIAGNOSIS — J439 Emphysema, unspecified: Secondary | ICD-10-CM | POA: Diagnosis not present

## 2015-01-06 NOTE — Progress Notes (Signed)
Today, Celise exercised at [facility]. Service time was from 1030 to 1210.  The patient exercised for more than 31 minutes performing aerobic, strengthening, and stretching exercises. Oxygen saturation, heart rate, blood pressure, rate of perceived exertion, and shortness of breath were all monitored before, during, and after exercise. [name] presented with no problems at today's exercise session.   There was no workload change during today's exercise session.  Pre-exercise vitals: . Weight kg: 80.8 . Liters of O2: 2L . SpO2: 95 . HR: 86 . BP: 136/62 . CBG: na  Exercise vitals: . Highest heartrate:  118 . Lowest oxygen saturation: 93 . Highest blood pressure: 142/80 . Liters of 02: 2L  Post-exercise vitals: . SpO2: 99 . HR: 99 . BP: 114/62 . Liters of O2: 2L . CBG: na  Dr. Brand Males, Medical Director Dr. Wyline Copas is immediately available during today's Pulmonary Rehab session for Toni Moore on 01/06/2015 at 1030 class time.

## 2015-01-08 ENCOUNTER — Encounter (HOSPITAL_COMMUNITY)
Admission: RE | Admit: 2015-01-08 | Discharge: 2015-01-08 | Disposition: A | Discharge status: Home / Self Care | Payer: BLUE CROSS/BLUE SHIELD | Source: Ambulatory Visit | Attending: Pulmonary Disease | Admitting: Pulmonary Disease

## 2015-01-08 DIAGNOSIS — J439 Emphysema, unspecified: Secondary | ICD-10-CM | POA: Diagnosis not present

## 2015-01-08 NOTE — Progress Notes (Signed)
Today, Toni Moore exercised at [facility]. Service time was from 10:30a to 12:30p.  The patient exercised for more than 31 minutes performing aerobic, strengthening, and stretching exercises. Oxygen saturation, heart rate, blood pressure, rate of perceived exertion, and shortness of breath were all monitored before, during, and after exercise. Mahdiya presented with no problems at today's exercise session. The patient attended education with [place].  There was no workload change during today's exercise session.  Pre-exercise vitals: . Weight kg: 80.9 . Liters of O2: 2 . SpO2: 93 . HR: 95 . BP: 138/60 . CBG: na  Exercise vitals: . Highest heartrate:  102 . Lowest oxygen saturation: 87% . Highest blood pressure: 142/70 . Liters of 02: 2  Post-exercise vitals: . SpO2: 97 . HR: 96 . BP: 116/66 . Liters of O2: 2 . CBG: na  Dr. Brand Males, Medical Director Dr. Candiss Norse is immediately available during today's Pulmonary Rehab session for Nataliyah Packham on 01/08/15 at 10:30a class time.

## 2015-01-13 ENCOUNTER — Encounter (HOSPITAL_COMMUNITY)
Admission: RE | Admit: 2015-01-13 | Discharge: 2015-01-13 | Disposition: A | Discharge status: Home / Self Care | Payer: BLUE CROSS/BLUE SHIELD | Source: Ambulatory Visit | Attending: Pulmonary Disease | Admitting: Pulmonary Disease

## 2015-01-13 DIAGNOSIS — J439 Emphysema, unspecified: Secondary | ICD-10-CM | POA: Diagnosis not present

## 2015-01-13 NOTE — Progress Notes (Signed)
Today, Toni Moore exercised at [employer]. [facility]. Service time was from 1030 to 1225.  The patient exercised for more than 31 minutes performing aerobic, strengthening, and stretching exercises. Oxygen saturation, heart rate, blood pressure, rate of perceived exertion, and shortness of breath were all monitored before, during, and after exercise. Toni Moore presented with no problems at today's exercise session.   There was no workload change during today's exercise session.  Pre-exercise vitals: . Weight kg: 81.3 . Liters of O2: 2 . SpO2: 97 . HR: 97 . BP: 120/56 . CBG: na  Exercise vitals: . Highest heartrate:  122 . Lowest oxygen saturation: 87 which increased to 92 with rest and purse lip breathing . Highest blood pressure: 132/80 . Liters of 02: 2  Post-exercise vitals: . SpO2: 96 . HR: 98 . BP: 118/60 . Liters of O2: ra . CBG: na Dr. Brand Males, Medical Director Dr. Candiss Norse is immediately available during today's Pulmonary Rehab session for Toni Moore on 01/13/2015 at 1030 class time.  Marland Kitchen

## 2015-01-15 ENCOUNTER — Encounter (HOSPITAL_COMMUNITY)
Admission: RE | Admit: 2015-01-15 | Discharge: 2015-01-15 | Disposition: A | Discharge status: Home / Self Care | Payer: BLUE CROSS/BLUE SHIELD | Source: Ambulatory Visit | Attending: Pulmonary Disease | Admitting: Pulmonary Disease

## 2015-01-15 DIAGNOSIS — J439 Emphysema, unspecified: Secondary | ICD-10-CM | POA: Diagnosis not present

## 2015-01-15 NOTE — Progress Notes (Signed)
Danaly completed a Six-Minute Walk Test on 01/15/15 . Katlin walked 1076 feet with 0 breaks.  This is compared to her 6MWT on 10/09/14 where she walked 818 feet with 2 rest breaks. The patient's lowest oxygen saturation was 88% , highest heart rate was 99 bpm , and highest blood pressure was 142/60. The patient was on 2 liters of oxygen with a nasal cannula. Korin stated that nothing hindered her walk test.

## 2015-01-19 NOTE — Progress Notes (Signed)
Pulmonary Rehabilitation Discharge Note: Toni Moore has been discharged from pulmonary rehab after successfully completing 24 exercise/education session. [name] increased her stamina and strength while in the program as evidenced by her ability to walk an additional 258 feet with no rest breaks during her post program walk test as compared to her entry 6 min walk test. [name] also met her personal goals of weaning off her nicotrol and to increase her ability to walk farther with less SOB. [name] continuously stated how much PLB helps her. While in the program, it was determined through close monitoring and her entry 6 min walk test that [name] would need oxygen while exercising. Unfortunately, [name] refuses to be prescribed oxygen for her home exercise routine. [name] states she will continue to exercise at home by either walking or using her elliptical.

## 2015-01-26 ENCOUNTER — Encounter: Payer: Self-pay | Admitting: Pulmonary Disease

## 2015-03-27 ENCOUNTER — Encounter: Payer: Self-pay | Admitting: Pulmonary Disease

## 2015-03-27 ENCOUNTER — Ambulatory Visit (INDEPENDENT_AMBULATORY_CARE_PROVIDER_SITE_OTHER): Payer: BLUE CROSS/BLUE SHIELD | Admitting: Pulmonary Disease

## 2015-03-27 VITALS — BP 132/74 | HR 75 | Ht 69.0 in | Wt 186.8 lb

## 2015-03-27 DIAGNOSIS — J449 Chronic obstructive pulmonary disease, unspecified: Secondary | ICD-10-CM | POA: Diagnosis not present

## 2015-03-27 DIAGNOSIS — R911 Solitary pulmonary nodule: Secondary | ICD-10-CM | POA: Diagnosis not present

## 2015-03-27 NOTE — Assessment & Plan Note (Signed)
CT chest in july

## 2015-03-27 NOTE — Patient Instructions (Signed)
Stay on advair & spiriva Stay on 2L O2 during sleep CT chest in july

## 2015-03-27 NOTE — Assessment & Plan Note (Signed)
Stay on advair & spiriva Stay on 2L O2 during sleep Completed pulm rehab
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
## 2015-06-26 ENCOUNTER — Ambulatory Visit (INDEPENDENT_AMBULATORY_CARE_PROVIDER_SITE_OTHER)
Admission: RE | Admit: 2015-06-26 | Discharge: 2015-06-26 | Disposition: A | Discharge status: Home / Self Care | Payer: BLUE CROSS/BLUE SHIELD | Source: Ambulatory Visit | Attending: Pulmonary Disease | Admitting: Pulmonary Disease

## 2015-06-26 ENCOUNTER — Telehealth: Payer: Self-pay | Admitting: Pulmonary Disease

## 2015-06-26 DIAGNOSIS — R911 Solitary pulmonary nodule: Secondary | ICD-10-CM

## 2015-06-26 NOTE — Telephone Encounter (Signed)
Aware but nothing that needs action today, send to Dr Elsworth Soho to address next week

## 2015-06-26 NOTE — Telephone Encounter (Signed)
Received call from Rudolph at [facility] for a CT chest WO contrast that was performed on 7.29.16. As this is a call report and RA is unavailable this afternoon, will send to doc of day to be addressed if necessary.   Dr. Melvyn Novas please advise.   IMPRESSION: Severe emphysematous changes stable from the prior exam.  Scattered nodular changes in both lungs. The previously seen changes in the lower lobes have resolved in the interval consistent with an infectious etiology. There has however been increased growth in the left upper lobe nodule along the major fissure as described. Given its growth over relatively short period of time further workup (repeat PET-CT versus biopsy) is recommended.  These results will be called to the ordering clinician or representative by the Radiologist Assistant, and communication documented in the PACS or zVision Dashboard.

## 2015-06-26 NOTE — Telephone Encounter (Signed)
Please advise RA thanks

## 2015-06-29 NOTE — Telephone Encounter (Signed)
lmomtcb x1 

## 2015-06-29 NOTE — Telephone Encounter (Signed)
LUL nodule has grown in size over last 7 months from 44m to 15 mm - suggest PET scan , then FU OV to discuss

## 2015-06-30 NOTE — Telephone Encounter (Signed)
Pt called back. Aware of results. PET scan ordered. Once scheduled will schedule for OV. Nothing further needed

## 2015-07-09 ENCOUNTER — Ambulatory Visit (HOSPITAL_COMMUNITY): Payer: BLUE CROSS/BLUE SHIELD

## 2015-07-10 ENCOUNTER — Ambulatory Visit (HOSPITAL_COMMUNITY)
Admission: RE | Admit: 2015-07-10 | Discharge: 2015-07-10 | Disposition: A | Discharge status: Home / Self Care | Payer: BLUE CROSS/BLUE SHIELD | Source: Ambulatory Visit | Attending: Pulmonary Disease | Admitting: Pulmonary Disease

## 2015-07-10 DIAGNOSIS — I700 Atherosclerosis of aorta: Secondary | ICD-10-CM | POA: Diagnosis not present

## 2015-07-10 DIAGNOSIS — K8020 Calculus of gallbladder without cholecystitis without obstruction: Secondary | ICD-10-CM | POA: Insufficient documentation

## 2015-07-10 DIAGNOSIS — R590 Localized enlarged lymph nodes: Secondary | ICD-10-CM | POA: Diagnosis not present

## 2015-07-10 DIAGNOSIS — J432 Centrilobular emphysema: Secondary | ICD-10-CM | POA: Diagnosis not present

## 2015-07-10 DIAGNOSIS — N281 Cyst of kidney, acquired: Secondary | ICD-10-CM | POA: Diagnosis not present

## 2015-07-10 DIAGNOSIS — K7460 Unspecified cirrhosis of liver: Secondary | ICD-10-CM | POA: Diagnosis not present

## 2015-07-10 DIAGNOSIS — R911 Solitary pulmonary nodule: Secondary | ICD-10-CM | POA: Insufficient documentation

## 2015-07-10 DIAGNOSIS — I2510 Atherosclerotic heart disease of native coronary artery without angina pectoris: Secondary | ICD-10-CM | POA: Diagnosis not present

## 2015-07-10 MED ORDER — FLUDEOXYGLUCOSE F - 18 (FDG) INJECTION
9.2000 | Freq: Once | INTRAVENOUS | Status: DC | PRN
  Administered 2015-07-10: 9.2 via INTRAVENOUS
  Filled 2015-07-10: qty 9.2

## 2015-07-13 LAB — GLUCOSE, CAPILLARY: GLUCOSE-CAPILLARY: 101 mg/dL — AB (ref 65–99)

## 2015-07-15 ENCOUNTER — Ambulatory Visit (INDEPENDENT_AMBULATORY_CARE_PROVIDER_SITE_OTHER): Payer: BLUE CROSS/BLUE SHIELD | Admitting: Pulmonary Disease

## 2015-07-15 ENCOUNTER — Encounter: Payer: Self-pay | Admitting: Pulmonary Disease

## 2015-07-15 VITALS — BP 120/82 | HR 96 | Ht 69.0 in | Wt 189.0 lb

## 2015-07-15 DIAGNOSIS — J432 Centrilobular emphysema: Secondary | ICD-10-CM

## 2015-07-15 DIAGNOSIS — R911 Solitary pulmonary nodule: Secondary | ICD-10-CM

## 2015-07-15 NOTE — Assessment & Plan Note (Addendum)
The left upper lobe nodule has certainly increased in size compared to 10/2014 and his hypermetabolic.  We discussed growing left upper lung nodule - that is likely cancer We discussed possible radiation therapy Refer to [facility] for multidisciplinary opinion ENt evaluation for upper airway findings on PET scan -these findings may be related to her dental issues, but will certainly need an upper airway exam.  The right lung nodule noted on PET scan is not clearly noted on CT from 05/2015 -likely inflammatory and will need 6 month follow-up
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
## 2015-07-15 NOTE — Assessment & Plan Note (Signed)
Unfortunately her lung function would not permit lung resection. She would likely need SBRt to left upper lobe nodule

## 2015-07-15 NOTE — Patient Instructions (Signed)
We discussed growing left upper lung nodule - that is likely cancer We discussed possible radiation therapy Refer to [facility] for multidisciplinary opinion ENt evaluation for upper airway findings on PEt scan

## 2015-07-16 ENCOUNTER — Encounter: Payer: Self-pay | Admitting: *Deleted

## 2015-07-16 ENCOUNTER — Telehealth: Payer: Self-pay | Admitting: *Deleted

## 2015-07-16 DIAGNOSIS — R911 Solitary pulmonary nodule: Secondary | ICD-10-CM

## 2015-07-16 NOTE — Telephone Encounter (Signed)
Oncology Nurse Navigator Documentation  Oncology Nurse Navigator Flowsheets 07/16/2015  Referral date to RadOnc/MedOnc 07/16/2015  Navigator Encounter Type Introductory phone call/I received a referral today from Dr. Elsworth Soho.  I triaged information and called patient with an appt.  I set her up to be seen at [facility] on 07/30/15 arrive at 1:30.  I did offer her an her an earlier appt but she declined  Treatment Phase Abnormal Scans  Interventions Coordination of Care  Coordination of Care MD Appointments  Time Spent with Patient 15

## 2015-07-29 ENCOUNTER — Telehealth: Payer: Self-pay | Admitting: *Deleted

## 2015-07-29 NOTE — Telephone Encounter (Signed)
Called and left message for the pt w/ appt info and directions.

## 2015-07-30 ENCOUNTER — Ambulatory Visit: Payer: BLUE CROSS/BLUE SHIELD | Attending: Internal Medicine | Admitting: Physical Therapy

## 2015-07-30 ENCOUNTER — Telehealth: Payer: Self-pay | Admitting: *Deleted

## 2015-07-30 ENCOUNTER — Other Ambulatory Visit: Payer: BLUE CROSS/BLUE SHIELD

## 2015-07-30 ENCOUNTER — Ambulatory Visit: Payer: BLUE CROSS/BLUE SHIELD | Admitting: Internal Medicine

## 2015-07-30 ENCOUNTER — Ambulatory Visit
Admission: RE | Admit: 2015-07-30 | Discharge: 2015-07-30 | Disposition: A | Discharge status: Home / Self Care | Payer: BLUE CROSS/BLUE SHIELD | Source: Ambulatory Visit | Attending: Radiation Oncology | Admitting: Radiation Oncology

## 2015-07-30 VITALS — BP 148/78 | HR 89 | Temp 97.7°F | Resp 17 | Wt 187.1 lb

## 2015-07-30 DIAGNOSIS — R0602 Shortness of breath: Secondary | ICD-10-CM

## 2015-07-30 DIAGNOSIS — R911 Solitary pulmonary nodule: Secondary | ICD-10-CM

## 2015-07-30 DIAGNOSIS — R5381 Other malaise: Secondary | ICD-10-CM

## 2015-07-30 DIAGNOSIS — R293 Abnormal posture: Secondary | ICD-10-CM

## 2015-07-30 NOTE — Telephone Encounter (Signed)
Oncology Nurse Navigator Documentation  Oncology Nurse Navigator Flowsheets 07/30/2015  Navigator Encounter Type Other/Called patient to update form thoracic cancer conference today.  She will just be seeing Dr. Pablo Ledger today at clinic.  Patient verbalized understanding of appt.   Treatment Phase -  Interventions Coordination of Care  Coordination of Care -  Time Spent with Patient 15

## 2015-07-30 NOTE — Therapy (Signed)
[place], [place], [address] Phone: [phone]   Fax:  [phone]  Physical Therapy Evaluation  Patient Details  Name: Toni Moore MRN: [id number] Date of Birth: 11/03/1956 Referring Provider:  Curt Bears, MD  Encounter Date: 07/30/2015      PT End of Session - 07/30/15 1850    Visit Number 1   Number of Visits 1   PT Start Time 1410   PT Stop Time 1435   PT Time Calculation (min) 25 min   Activity Tolerance Patient tolerated treatment well;Other (comment)  some SOB with evaluation   Behavior During Therapy [facility] for tasks assessed/performed      Past Medical History  Diagnosis Date  . Unspecified chronic bronchitis   . Hyperlipidemia   . Vitamin D deficiency   . Thrombocytopenia   . COPD (chronic obstructive pulmonary disease)     Past Surgical History  Procedure Laterality Date  . Tubal ligation  1985  . Hammer toe surgery      x3    There were no vitals filed for this visit.  Visit Diagnosis:  Shortness of breath on exertion - Plan: PT plan of care cert/re-cert  Poor posture - Plan: PT plan of care cert/re-cert  Physical debility - Plan: PT plan of care cert/re-cert      Subjective Assessment - 07/30/15 1839    Subjective "Whole body" hurst sometimes.   Patient is accompained by: Family member  husband   Pertinent History Pt. had been followed due to severe COPD and pulmonary nodule.  Now presents with cough and SOB.  Scans showed left upper lobe nodule and right ground galss opacity; also cervical node.  No pathology to date.  Has been through pulmonary rehab recently.  On 2L O2 at bedtime.  Ex-smoker.                                                  Patient Stated Goals get input from all lung clinic providers   Currently in Pain? Yes   Pain Score 4    Pain Location Other (Comment)  whole body   Pain Descriptors / Indicators Sore   Aggravating Factors  lying  still   Pain Relieving Factors after moving a bit            [facility] PT Assessment - 07/30/15 0001    Assessment   Medical Diagnosis left upper lobe nodule,type not yet identified   Onset Date/Surgical Date 06/26/15   Precautions   Precautions Other (comment)  cancer precautions   Restrictions   Weight Bearing Restrictions No   Balance Screen   Has the patient fallen in the past 6 months No   Has the patient had a decrease in activity level because of a fear of falling?  No   Is the patient reluctant to leave their home because of a fear of falling?  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   Ambulation/Gait   Ambulation/Gait Yes   Ambulation/Gait Assistance 7: Independent  but limited by pulmonary function; had low O2 sats in rehab   Balance   Balance Assessed Yes   Dynamic Standing Balance   Dynamic Standing - Balance Activities --  forward reach of 15 inches                           PT Education - 07/30/15 1849    Education provided Yes   Education Details posture, breathing,  walking, staying active, energy conservation, PT info   Person(s) Educated Patient;Spouse   Methods Explanation;Handout   Comprehension Verbalized understanding               Lung Clinic Goals - 07/30/15 1854    Patient will be able to verbalize understanding of the benefit of exercise to decrease fatigue.   Status Achieved   Patient will be able to verbalize the importance of posture.   Status Achieved   Patient will be able to demonstrate diaphragmatic breathing for improved lung function.   Status Achieved   Patient will be able to verbalize understanding of the role of physical therapy to prevent functional decline and who to contact if physical therapy is needed.   Status Achieved             Plan - 07/30/15 1851    Clinical Impression Statement Patient with severe COPD who has been through pulmonary rehab, now with left upper lobe lung nodule, but no diagnostic pathology report yet.     Pt will benefit from skilled therapeutic intervention in order to improve on the following deficits Cardiopulmonary status limiting activity   Rehab Potential Fair   PT Frequency One time visit   PT Treatment/Interventions Patient/family education   PT Next Visit Plan None at this time.   PT Home Exercise Plan see education section   Consulted and Agree with Plan of Care Patient         Problem List Patient Active Problem List   Diagnosis Date Noted  . COPD (chronic obstructive pulmonary disease) 07/28/2014  . Solitary pulmonary nodule 07/28/2014    Bellanie Matthew 07/30/2015, 6:58 PM  [place], [place], [address] Phone: [phone]   Fax:  [place], PT 07/30/2015 6:58 PM

## 2015-07-30 NOTE — Progress Notes (Signed)
Radiation Oncology         [phone]) [phone] ________________________________  Initial outpatient Consultation - Date: 07/30/2015   Name: Toni Moore MRN: [id number]   DOB: 1956-02-25  REFERRING PHYSICIAN: Vicenta Aly, FNP  DIAGNOSIS AND STAGE: T1N0 Left Upper Lobe Lung Cancer  HISTORY OF PRESENT ILLNESS::Toni Moore is a 59 y.o. female who presents with a history of COPD and a pulmonary nodule. She now presents to me with a cough and shortness of breath. CT of the chest on 06/26/15 showed several nodules in the left upper lobe. The largest now measures 15 mm. A PET scan on 8/12 shows uptake in this nodule as well as the left vallecula and cervical lymph nodes. The left upper lobe nodules has a SUV of 4.6. She has been referred to ENT for evaluation of the findings in her neck. She has poor lung function and severe COPD and is not a candidate for biopsy or surgery. I was asked to see her today for my opinion regarding radiation and the management of her disease. She denies hemoptysis, but reports using 2 L of oxygen at night while sleeping.  PREVIOUS RADIATION THERAPY: No  Past medical, social and family history were reviewed in the electronic chart. Review of symptoms was reviewed in the electronic chart. Medications were reviewed in the electronic chart.   PHYSICAL EXAM:  Filed Vitals:   07/30/15 1410  BP: 148/78  Pulse: 89  Temp: 97.7 F (36.5 C)  Resp: 17  .187 lb 1.6 oz (84.868 kg). Alert and oriented x 3 and in no distressed. Normal respiratory effort.  IMPRESSION: T1N0 Left Upper Lobe Lung Cancer  PLAN: She was seen today at the multidisciplinary thoracic oncology clinic. We discussed that it is possible these areas are not intact cancer and are things such as inflammation or infection. We discussed that if that there is probably a 1 in 20 to 1 in 10 chance of these are things other than cancer. Only a biopsy would tell us if this was cancer. That being said there is  increasing evidence in patients who have the correct clinical history which she certainly does that SBRT to be effective in treating these nodules which are likely non-small cell lung cancer. We discussed that she has stage I lung cancer and the results of SBRT with provide a greater than 90% local control. We discussed that she may fail in the mediastinum, hilum or elsewhere in the body and radiation was a local only process. We discussed the process of simulation and the use of a paddle to decrease respiratory motion. We discussed the use of 4-dimensional simulation to minimize normal lung tissue treated. We discussed 3- 5 treatments occurring every other day as an outpatient. We discussed these treatments will last about 10-20 minutes and she would be here at the hospital for about an hour. We discussed that SBRT was unlikely to make his breathing any worse. It is also unlikely to make her breathing symptoms any better. We discussed possible side effects including shoulder pain due to arm positioning and possible rib fracture due to the pleural-based nodules proximity to the ribs. I discussed with her that without treatement this could develop into a more aggressive or even metastatic cancer ifthese go untreated. She understands these risks.  CT simulation is scheduled on September 8 at 10:30AM. The pt signed a consent form and this was placed in her medical chart.  I spent 40 minutes face to face with the patient and more than  50% of that time was spent in counseling and/or coordination of care.   This document serves as a record of services personally performed by Thea Silversmith, MD. It was created on her behalf by Darcus Austin, a trained medical scribe. The creation of this record is based on the scribe's personal observations and the provider's statements to them. This document has been checked and approved by the attending provider.   ------------------------------------------------  Thea Silversmith, MD

## 2015-07-31 ENCOUNTER — Ambulatory Visit (INDEPENDENT_AMBULATORY_CARE_PROVIDER_SITE_OTHER): Payer: BLUE CROSS/BLUE SHIELD | Admitting: Adult Health

## 2015-07-31 ENCOUNTER — Encounter: Payer: Self-pay | Admitting: Adult Health

## 2015-07-31 VITALS — BP 120/88 | HR 82 | Temp 97.9°F | Ht 70.0 in | Wt 185.0 lb

## 2015-07-31 DIAGNOSIS — R911 Solitary pulmonary nodule: Secondary | ICD-10-CM

## 2015-07-31 DIAGNOSIS — J449 Chronic obstructive pulmonary disease, unspecified: Secondary | ICD-10-CM

## 2015-07-31 DIAGNOSIS — J9611 Chronic respiratory failure with hypoxia: Secondary | ICD-10-CM

## 2015-07-31 DIAGNOSIS — J9610 Chronic respiratory failure, unspecified whether with hypoxia or hypercapnia: Secondary | ICD-10-CM | POA: Insufficient documentation

## 2015-07-31 NOTE — Assessment & Plan Note (Signed)
Severe gold 4 COPD , compensated on Advair and Spiriva.

## 2015-07-31 NOTE — Assessment & Plan Note (Signed)
Continue on oxygen at bedtime

## 2015-07-31 NOTE — Assessment & Plan Note (Signed)
Suspected underlying lung cancer with growing left upper lobe nodule and hypermetabolic activity on PET scan Patient is considered a nonsurgical and biopsy candidate. Patient has opted to undergo SBRT .  She is to follow-up with Dr. Pablo Ledger as planned.
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
## 2015-07-31 NOTE — Patient Instructions (Addendum)
Continue on Advair and Spiriva  Continue on Oxygen 2l/m At bedtime   Follow up with Dr. Pablo Ledger as planned next week.  follow up Dr. Elsworth Soho  In 3 months and As needed

## 2015-08-05 ENCOUNTER — Telehealth: Payer: Self-pay | Admitting: *Deleted

## 2015-08-05 NOTE — Telephone Encounter (Signed)
Oncology Nurse Navigator Documentation  Oncology Nurse Navigator Flowsheets 08/05/2015  Navigator Encounter Type Telephone/Called patient to follow up from thoracic clinic on 07/30/15.  I left a vm message with my name and phone number to call if needed.   Patient Visit Type Follow-up  Treatment Phase Abnormal Scans  Time Spent with Patient 15

## 2015-08-06 ENCOUNTER — Ambulatory Visit
Admission: RE | Admit: 2015-08-06 | Discharge: 2015-08-06 | Disposition: A | Discharge status: Home / Self Care | Payer: BLUE CROSS/BLUE SHIELD | Source: Ambulatory Visit | Attending: Radiation Oncology | Admitting: Radiation Oncology

## 2015-08-06 DIAGNOSIS — Z510 Encounter for antineoplastic radiation therapy: Secondary | ICD-10-CM | POA: Insufficient documentation

## 2015-08-06 DIAGNOSIS — R911 Solitary pulmonary nodule: Secondary | ICD-10-CM

## 2015-08-06 DIAGNOSIS — C3412 Malignant neoplasm of upper lobe, left bronchus or lung: Secondary | ICD-10-CM | POA: Diagnosis present

## 2015-08-06 NOTE — Progress Notes (Signed)
[facility] Simulation and Treatment Planning Note   Name: Toni Moore MRN: [id number]  Date: 08/06/2015  DOB: [birth year]   DIAGNOSIS: T1No NSCLC of the left upper lobe  CONSENT VERIFIED: yes  SET UP AND IMMOBILIZATION: Patient is setup supine in a vac loc with a custom moldable pillow for head and neck immobilization   NARRATIVE: The patient was brought to the [facility].  Identity was confirmed.  All relevant records and images related to the planned course of therapy were reviewed.  Then, the patient was positioned in a stable reproducible clinical set-up for radiation therapy.  CT images were obtained.  Skin markings were placed.  A four dimensional simulation was then performed to track tumor movement throughout the patients' breathing cycle. The CT images were loaded into the planning software where the target and avoidance structures were contoured.  The GTV was outlined on the free breathing, 4D and MIP image sets.  The radiation prescription was entered and confirmed.   TREATMENT PLANNING NOTE:  Treatment planning then occurred. I have requested 3D simulation with [facility] of the spinal cord, total lungs and gross tumor volume. I have also requested mlcs and an isodose plan.   Special treatment procedure will be performed as Foye Deer will be receiving high dose per fraction.     This document serves as a record of services personally performed by Thea Silversmith , MD. It was created on her behalf by Lenn Cal, a trained medical scribe. The creation of this record is based on the scribe's personal observations and the provider's statements to them. This document has been checked and approved by the attending provider.   ------------------------------------------------  Thea Silversmith, MD

## 2015-08-10 ENCOUNTER — Telehealth: Payer: Self-pay | Admitting: *Deleted

## 2015-08-10 NOTE — Telephone Encounter (Signed)
Oncology Nurse Navigator Documentation  Oncology Nurse Navigator Flowsheets 08/10/2015  Navigator Encounter Type Telephone/called patient to check on her after her SIM on Friday.  She stated she is doing well.  She asked about follow up appt.  I clarified.   Patient Visit Type Follow-up  Treatment Phase Treatment  Barriers/Navigation Needs Education  Education Other  Interventions -  Coordination of Care -  Time Spent with Patient 15

## 2015-08-18 DIAGNOSIS — Z510 Encounter for antineoplastic radiation therapy: Secondary | ICD-10-CM | POA: Diagnosis not present

## 2015-08-19 ENCOUNTER — Ambulatory Visit: Payer: BLUE CROSS/BLUE SHIELD | Admitting: Radiation Oncology

## 2015-08-19 ENCOUNTER — Ambulatory Visit: Payer: BLUE CROSS/BLUE SHIELD | Admitting: Pulmonary Disease

## 2015-08-20 ENCOUNTER — Ambulatory Visit: Payer: BLUE CROSS/BLUE SHIELD | Admitting: Radiation Oncology

## 2015-08-21 ENCOUNTER — Ambulatory Visit: Payer: BLUE CROSS/BLUE SHIELD | Admitting: Radiation Oncology

## 2015-08-24 ENCOUNTER — Ambulatory Visit: Payer: BLUE CROSS/BLUE SHIELD | Admitting: Radiation Oncology

## 2015-08-27 ENCOUNTER — Ambulatory Visit: Payer: BLUE CROSS/BLUE SHIELD | Admitting: Radiation Oncology

## 2015-08-28 ENCOUNTER — Ambulatory Visit: Payer: BLUE CROSS/BLUE SHIELD | Admitting: Radiation Oncology

## 2015-08-28 ENCOUNTER — Encounter: Payer: Self-pay | Admitting: *Deleted

## 2015-08-28 NOTE — Progress Notes (Signed)
Oncology Nurse Navigator Documentation  Oncology Nurse Navigator Flowsheets 08/28/2015  Navigator Encounter Type Other/I was following up from thoracic clinic and noted patient has SIM on 08/06/15 but does not have follow up for tx.  I contacted Santiago Glad in [place] about appt and if I can help arrange.    Patient Visit Type Follow-up  Treatment Phase Other  Time Spent with Patient 15

## 2015-09-03 ENCOUNTER — Other Ambulatory Visit: Payer: Self-pay | Admitting: Radiation Oncology

## 2015-09-03 ENCOUNTER — Ambulatory Visit: Payer: BLUE CROSS/BLUE SHIELD | Admitting: Radiation Oncology

## 2015-09-03 DIAGNOSIS — R911 Solitary pulmonary nodule: Secondary | ICD-10-CM

## 2015-09-04 ENCOUNTER — Ambulatory Visit: Payer: BLUE CROSS/BLUE SHIELD | Admitting: Radiation Oncology

## 2015-09-08 ENCOUNTER — Ambulatory Visit
Admission: RE | Admit: 2015-09-08 | Discharge: 2015-09-08 | Disposition: A | Discharge status: Home / Self Care | Payer: BLUE CROSS/BLUE SHIELD | Source: Ambulatory Visit | Attending: Radiation Oncology | Admitting: Radiation Oncology

## 2015-09-08 VITALS — BP 130/84 | HR 90 | Temp 97.8°F | Wt 189.9 lb

## 2015-09-08 DIAGNOSIS — Z510 Encounter for antineoplastic radiation therapy: Secondary | ICD-10-CM | POA: Diagnosis not present

## 2015-09-08 DIAGNOSIS — R911 Solitary pulmonary nodule: Secondary | ICD-10-CM

## 2015-09-08 NOTE — Progress Notes (Signed)
Weekly Management Note Current Dose: 18  Gy  Projected Dose: 54 Gy   Narrative:  The patient presents for routine under treatment assessment.  CBCT/MVCT images/Port film x-rays were reviewed.  The chart was checked. Doing well. No complaints.   Physical Findings: Weight: 189 lb 14.4 oz (86.138 kg). Unchanged  Impression:  The patient is tolerating radiation.  Plan:  Continue treatment as planned.

## 2015-09-08 NOTE — Progress Notes (Signed)
Patient has completed 1 of 3 treatments of SBRT to chest.Denies pain.Informed she really shouldn't have side effects except possible fatigue.Given calendar for remaining treatments. BP 130/84 mmHg  Pulse 90  Temp(Src) 97.8 F (36.6 C)  Wt 189 lb 14.4 oz (86.138 kg)  SpO2 99%

## 2015-09-08 NOTE — Addendum Note (Signed)
Encounter addended by: Thea Silversmith, MD on: 09/08/2015  4:21 PM<BR>     Documentation filed: Follow-up Section, LOS Section, Notes Section, Problem List

## 2015-09-11 ENCOUNTER — Ambulatory Visit (HOSPITAL_COMMUNITY): Payer: BLUE CROSS/BLUE SHIELD

## 2015-09-11 ENCOUNTER — Ambulatory Visit
Admission: RE | Admit: 2015-09-11 | Discharge: 2015-09-11 | Disposition: A | Discharge status: Home / Self Care | Payer: BLUE CROSS/BLUE SHIELD | Source: Ambulatory Visit | Attending: Radiation Oncology | Admitting: Radiation Oncology

## 2015-09-11 DIAGNOSIS — Z510 Encounter for antineoplastic radiation therapy: Secondary | ICD-10-CM | POA: Diagnosis not present

## 2015-09-14 ENCOUNTER — Ambulatory Visit (HOSPITAL_COMMUNITY): Payer: BLUE CROSS/BLUE SHIELD

## 2015-09-15 ENCOUNTER — Ambulatory Visit
Admission: RE | Admit: 2015-09-15 | Discharge: 2015-09-15 | Disposition: A | Discharge status: Home / Self Care | Payer: BLUE CROSS/BLUE SHIELD | Source: Ambulatory Visit | Attending: Radiation Oncology | Admitting: Radiation Oncology

## 2015-09-15 DIAGNOSIS — Z510 Encounter for antineoplastic radiation therapy: Secondary | ICD-10-CM | POA: Diagnosis not present

## 2015-09-17 ENCOUNTER — Encounter: Payer: Self-pay | Admitting: Radiation Oncology

## 2015-09-17 NOTE — Progress Notes (Signed)
  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  Plan: The patient has completed radiation treatment. The patient will return to radiation oncology clinic for routine followup in one month. I advised them to call or return sooner if they have any questions or concerns related to their recovery or treatment.    This document serves as a record of services personally performed by Thea Silversmith , MD. It was created on her behalf by Lenn Cal, a trained medical scribe. The creation of this record is based on the scribe's personal observations and the provider's statements to them. This document has been checked and approved by the attending provider.   ------------------------------------------------  Thea Silversmith, MD

## 2015-09-30 ENCOUNTER — Encounter: Payer: Self-pay | Admitting: Radiation Oncology

## 2015-09-30 ENCOUNTER — Ambulatory Visit: Payer: BLUE CROSS/BLUE SHIELD | Admitting: Pulmonary Disease

## 2015-09-30 DIAGNOSIS — C3412 Malignant neoplasm of upper lobe, left bronchus or lung: Secondary | ICD-10-CM | POA: Insufficient documentation

## 2015-10-14 NOTE — Progress Notes (Signed)
   Department of Radiation Oncology  Phone:  [phone] Fax:        [phone]   Name: Toni Moore MRN: [id number]  DOB: January 27, 1956  Date: 10/15/2015  Follow Up Visit Note  Diagnosis:T1N0 non-small cell lung cancer of the left upper lung.  Summary and Interval since last radiation: 54 Gy in 3 fractions completed 09/15/15.  Interval History: Toni Moore presents today for routine followup. She is having pain in bilateral upper lungs at night for the last two nights. She feels it may be due to not having her humidifier on when wearing her oxygen. She does not think her medicine is working because she feels more breathless than normal. She had a follow up with Dr. Elsworth Soho 09/30/15 but it was cancelled and could not be rescheduled until February. She has an appt with her PCP in December. She had some itching over her left shoulder after radiation which responded to hydrocortisone.   Physical Exam:  Filed Vitals:   10/15/15 1526  BP: 151/78  Pulse: 91  Temp: 98.3 F (36.8 C)  TempSrc: Oral  Resp: 16  Weight: 192 lb 14.4 oz (87.499 kg)  SpO2: 97%   Healing dry desquamation over her left shoulder.  IMPRESSION: Toni Moore is a 59 y.o. female with T1N0 Non-small cell lung cancer of the left upper lung.  PLAN: She will be scheduled for a CT scan in February prior to appointment with Dr. Elsworth Soho. She does not want to see anyone about the breathing issues right now so she will follow up with her appointment with her general practitioner 11/12/15. I will see her in August with a CT as well. She will call me or her family physician if she feels her breathing is worsening and she requires medication adjustment.    Thea Silversmith, MD    This document serves as a record of services personally performed by Thea Silversmith, MD. It was created on her behalf by  Lendon Collar, a trained medical scribe. The creation of this record is based on the scribe's personal observations and the provider's  statements to them. This document has been checked and approved by the attending provider.

## 2015-10-15 ENCOUNTER — Encounter: Payer: Self-pay | Admitting: Radiation Oncology

## 2015-10-15 ENCOUNTER — Ambulatory Visit
Admission: RE | Admit: 2015-10-15 | Discharge: 2015-10-15 | Disposition: A | Discharge status: Home / Self Care | Payer: BLUE CROSS/BLUE SHIELD | Source: Ambulatory Visit | Attending: Radiation Oncology | Admitting: Radiation Oncology

## 2015-10-15 VITALS — BP 151/78 | HR 91 | Temp 98.3°F | Resp 16 | Wt 192.9 lb

## 2015-10-15 DIAGNOSIS — R911 Solitary pulmonary nodule: Secondary | ICD-10-CM

## 2015-10-15 NOTE — Progress Notes (Addendum)
PAIN: She is currently in no pain. Reports pain to bilateral upper lungs at night for the last two, rating 7/10, described at "poking" pain. She feels it may be due to not having her humidifier on when wearing her oxygen.  RESPIRATORY: Shortness of Breath  Walking and Coughing  Dry. Pt is on 2 liters/min via nasal cannula only at night. Approx 2 x 3 inches irritation area on upper left posterior shoulder. Pt reports Dryness, Hyperpigmentation and Pruritus. SWALLOWING/DIET: Pt denies dysphagia. Dry mouth continues BP 151/78 mmHg  Pulse 91  Temp(Src) 98.3 F (36.8 C) (Oral)  Resp 16  Wt 192 lb 14.4 oz (87.499 kg)  SpO2 97% Wt Readings from Last 3 Encounters:  10/15/15 192 lb 14.4 oz (87.499 kg)  09/08/15 189 lb 14.4 oz (86.138 kg)  07/31/15 185 lb (83.915 kg)   Last Pulmonology appointment Dr Elsworth Soho: September 2016 Next: 01/11/15

## 2015-10-16 ENCOUNTER — Telehealth: Payer: Self-pay | Admitting: *Deleted

## 2015-10-16 NOTE — Telephone Encounter (Signed)
CALLED PATIENT TO INFORM OF CT ON 01-08-16 @ 2:30 PM @ [facility] AND CT ON 07-19-16 - @ 2:30 PM @ [facility] AND HER FU WITH DR. Pablo Ledger ON 07-21-16 @ 1 PM, SPOKE WITH PATIENT AND SHE IS AWARE OF THESE APPTS.

## 2015-10-27 ENCOUNTER — Telehealth: Payer: Self-pay | Admitting: Pulmonary Disease

## 2015-10-27 NOTE — Telephone Encounter (Signed)
Patient calling back to schedule appointment Scheduled patient to see Dr. Elsworth Soho tomorrow at 11am Nothing further needed. Closing encounter

## 2015-10-28 ENCOUNTER — Ambulatory Visit (INDEPENDENT_AMBULATORY_CARE_PROVIDER_SITE_OTHER)
Admission: RE | Admit: 2015-10-28 | Discharge: 2015-10-28 | Disposition: A | Discharge status: Home / Self Care | Payer: BLUE CROSS/BLUE SHIELD | Source: Ambulatory Visit | Attending: Pulmonary Disease | Admitting: Pulmonary Disease

## 2015-10-28 ENCOUNTER — Ambulatory Visit (INDEPENDENT_AMBULATORY_CARE_PROVIDER_SITE_OTHER): Payer: BLUE CROSS/BLUE SHIELD | Admitting: Pulmonary Disease

## 2015-10-28 ENCOUNTER — Encounter: Payer: Self-pay | Admitting: Pulmonary Disease

## 2015-10-28 VITALS — BP 138/88 | HR 80 | Temp 98.3°F | Ht 69.0 in | Wt 190.2 lb

## 2015-10-28 DIAGNOSIS — J700 Acute pulmonary manifestations due to radiation: Secondary | ICD-10-CM

## 2015-10-28 DIAGNOSIS — J449 Chronic obstructive pulmonary disease, unspecified: Secondary | ICD-10-CM | POA: Diagnosis not present

## 2015-10-28 DIAGNOSIS — C3412 Malignant neoplasm of upper lobe, left bronchus or lung: Secondary | ICD-10-CM

## 2015-10-28 MED ORDER — PREDNISONE 10 MG PO TABS: ORAL_TABLET | ORAL | Status: DC

## 2015-10-28 NOTE — Patient Instructions (Signed)
You may have some radiation induced damage to the lung CXR today Prednisone 10 mg tabs  Take 2 tabs daily with food x 7ds, then 1 tab daily with food x 7ds then STOP
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
## 2015-10-28 NOTE — Assessment & Plan Note (Signed)
Continue Advair and Spiriva

## 2015-10-28 NOTE — Assessment & Plan Note (Signed)
Follow-up scan has been arranged for February 2017

## 2015-10-28 NOTE — Assessment & Plan Note (Addendum)
The cause for her increased dyspnea is unclear- there is no obvious pneumonitis on chest x-ray to suggest  radiation induced damage to the lung. However this is still most likely the cause for worsening dyspnea We'll treat empirically with prednisone for a short course Prednisone 10 mg tabs  Take 2 tabs daily with food x 7ds, then 1 tab daily with food x 7ds then STOP

## 2015-11-12 DIAGNOSIS — Z87891 Personal history of nicotine dependence: Secondary | ICD-10-CM | POA: Insufficient documentation

## 2016-01-08 ENCOUNTER — Ambulatory Visit (HOSPITAL_COMMUNITY)
Admission: RE | Admit: 2016-01-08 | Discharge: 2016-01-08 | Disposition: A | Discharge status: Home / Self Care | Payer: Managed Care, Other (non HMO) | Source: Ambulatory Visit | Attending: Radiation Oncology | Admitting: Radiation Oncology

## 2016-01-08 DIAGNOSIS — J439 Emphysema, unspecified: Secondary | ICD-10-CM | POA: Diagnosis not present

## 2016-01-08 DIAGNOSIS — K7460 Unspecified cirrhosis of liver: Secondary | ICD-10-CM | POA: Insufficient documentation

## 2016-01-08 DIAGNOSIS — Z923 Personal history of irradiation: Secondary | ICD-10-CM | POA: Insufficient documentation

## 2016-01-08 DIAGNOSIS — C3492 Malignant neoplasm of unspecified part of left bronchus or lung: Secondary | ICD-10-CM | POA: Diagnosis not present

## 2016-01-08 DIAGNOSIS — R911 Solitary pulmonary nodule: Secondary | ICD-10-CM

## 2016-01-08 DIAGNOSIS — K8020 Calculus of gallbladder without cholecystitis without obstruction: Secondary | ICD-10-CM | POA: Insufficient documentation

## 2016-01-08 DIAGNOSIS — X58XXXA Exposure to other specified factors, initial encounter: Secondary | ICD-10-CM | POA: Insufficient documentation

## 2016-01-08 DIAGNOSIS — S2232XA Fracture of one rib, left side, initial encounter for closed fracture: Secondary | ICD-10-CM | POA: Insufficient documentation

## 2016-01-12 ENCOUNTER — Ambulatory Visit (INDEPENDENT_AMBULATORY_CARE_PROVIDER_SITE_OTHER): Payer: Managed Care, Other (non HMO) | Admitting: Pulmonary Disease

## 2016-01-12 ENCOUNTER — Encounter: Payer: Self-pay | Admitting: Pulmonary Disease

## 2016-01-12 VITALS — BP 140/90 | HR 94 | Ht 69.0 in | Wt 190.6 lb

## 2016-01-12 DIAGNOSIS — J439 Emphysema, unspecified: Secondary | ICD-10-CM | POA: Diagnosis not present

## 2016-01-12 DIAGNOSIS — C3412 Malignant neoplasm of upper lobe, left bronchus or lung: Secondary | ICD-10-CM

## 2016-01-12 DIAGNOSIS — J700 Acute pulmonary manifestations due to radiation: Secondary | ICD-10-CM
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
## 2016-01-12 NOTE — Assessment & Plan Note (Signed)
CT shows decrease in size of nodule -LUL LLL nodule is benign FU CT in 06/2015

## 2016-01-12 NOTE — Patient Instructions (Signed)
CT shows decrease in size of nodule Change to BREO 100 once daily instead of advair Stay on spiriva

## 2016-01-12 NOTE — Assessment & Plan Note (Signed)
resolved 

## 2016-01-12 NOTE — Assessment & Plan Note (Signed)
Change to BREO 100 once daily instead of advair - she will call for Rx if this works Stay on [place]

## 2016-01-28 ENCOUNTER — Telehealth: Payer: Self-pay | Admitting: Pulmonary Disease

## 2016-01-28 MED ORDER — FLUTICASONE FUROATE-VILANTEROL 100-25 MCG/INH IN AEPB: 1.0000 | INHALATION_SPRAY | Freq: Every day | RESPIRATORY_TRACT | Status: DC

## 2016-01-28 NOTE — Telephone Encounter (Signed)
Spoke with pt. She needs a prescription sent in to [facility] for Breo 100. Rx has been sent in. Nothing further was needed.

## 2016-05-12 ENCOUNTER — Telehealth: Payer: Self-pay | Admitting: Pulmonary Disease

## 2016-05-12 NOTE — Telephone Encounter (Signed)
Office phones are now turned off for a meeting. Will try back after 1pm.

## 2016-05-13 DIAGNOSIS — R9402 Abnormal brain scan: Secondary | ICD-10-CM | POA: Insufficient documentation

## 2016-05-13 NOTE — Telephone Encounter (Signed)
Spoke with Toni Aly, NP. States that she wanted to speak to Dr. Elsworth Soho about the pt's PET scan. She wanted to know if we had sent the pt back to see her ENT. From looking in the pt's OV noted, nothing was addressed about this. Helene Kelp states she will try to get the pt back to ENT. Nothing further was needed.

## 2016-06-07 DIAGNOSIS — R490 Dysphonia: Secondary | ICD-10-CM | POA: Insufficient documentation

## 2016-06-07 DIAGNOSIS — J383 Other diseases of vocal cords: Secondary | ICD-10-CM | POA: Insufficient documentation

## 2016-07-19 ENCOUNTER — Ambulatory Visit (HOSPITAL_COMMUNITY)
Admission: RE | Admit: 2016-07-19 | Discharge: 2016-07-19 | Disposition: A | Discharge status: Home / Self Care | Payer: Managed Care, Other (non HMO) | Source: Ambulatory Visit | Attending: Radiation Oncology | Admitting: Radiation Oncology

## 2016-07-19 ENCOUNTER — Encounter (HOSPITAL_COMMUNITY): Payer: Self-pay

## 2016-07-19 DIAGNOSIS — R911 Solitary pulmonary nodule: Secondary | ICD-10-CM

## 2016-07-19 DIAGNOSIS — K7460 Unspecified cirrhosis of liver: Secondary | ICD-10-CM | POA: Diagnosis not present

## 2016-07-19 DIAGNOSIS — I700 Atherosclerosis of aorta: Secondary | ICD-10-CM | POA: Insufficient documentation

## 2016-07-19 DIAGNOSIS — I2510 Atherosclerotic heart disease of native coronary artery without angina pectoris: Secondary | ICD-10-CM | POA: Diagnosis not present

## 2016-07-19 DIAGNOSIS — J439 Emphysema, unspecified: Secondary | ICD-10-CM | POA: Diagnosis not present

## 2016-07-21 ENCOUNTER — Ambulatory Visit: Payer: BLUE CROSS/BLUE SHIELD | Admitting: Radiation Oncology

## 2016-07-25 ENCOUNTER — Encounter: Payer: Self-pay | Admitting: Radiation Oncology

## 2016-07-25 ENCOUNTER — Encounter: Payer: Self-pay | Admitting: Pulmonary Disease

## 2016-07-25 ENCOUNTER — Ambulatory Visit (INDEPENDENT_AMBULATORY_CARE_PROVIDER_SITE_OTHER): Payer: Managed Care, Other (non HMO) | Admitting: Pulmonary Disease

## 2016-07-25 ENCOUNTER — Ambulatory Visit
Admission: RE | Admit: 2016-07-25 | Discharge: 2016-07-25 | Disposition: A | Discharge status: Home / Self Care | Payer: Managed Care, Other (non HMO) | Source: Ambulatory Visit | Attending: Radiation Oncology | Admitting: Radiation Oncology

## 2016-07-25 VITALS — BP 146/78 | HR 98 | Temp 98.0°F | Resp 16 | Wt 195.6 lb

## 2016-07-25 VITALS — BP 122/82 | HR 99 | Ht 69.0 in | Wt 195.2 lb

## 2016-07-25 DIAGNOSIS — K7460 Unspecified cirrhosis of liver: Secondary | ICD-10-CM | POA: Insufficient documentation

## 2016-07-25 DIAGNOSIS — R918 Other nonspecific abnormal finding of lung field: Secondary | ICD-10-CM | POA: Insufficient documentation

## 2016-07-25 DIAGNOSIS — M47814 Spondylosis without myelopathy or radiculopathy, thoracic region: Secondary | ICD-10-CM | POA: Insufficient documentation

## 2016-07-25 DIAGNOSIS — I700 Atherosclerosis of aorta: Secondary | ICD-10-CM | POA: Diagnosis not present

## 2016-07-25 DIAGNOSIS — C3412 Malignant neoplasm of upper lobe, left bronchus or lung: Secondary | ICD-10-CM | POA: Diagnosis not present

## 2016-07-25 DIAGNOSIS — R0683 Snoring: Secondary | ICD-10-CM | POA: Diagnosis not present

## 2016-07-25 DIAGNOSIS — J439 Emphysema, unspecified: Secondary | ICD-10-CM

## 2016-07-25 NOTE — Patient Instructions (Addendum)
It is nice to meet you today. Continue wearing your nocturnal oxygen as you have been doing. Continue taking your Breo 1 puff once daily.  Rinse your mouth after use. Continue your Spiriva one tablet daily. We will schedule you for a CT in 6 months with appointment with Dr. Elsworth Soho after CT. We will schedule you for a sleep study in the sleep lab today. We will call you with the results. Please contact office for sooner follow up if symptoms do not improve or worsen or seek emergency care.

## 2016-07-25 NOTE — Assessment & Plan Note (Signed)
Stable COPD Plan: Continue wearing your nocturnal oxygen as you have been doing. Continue taking your Breo 1 puff once daily.  Rinse your mouth after use. Continue your Spiriva one tablet daily. We will schedule you for a CT in 6 months with appointment with Dr. Elsworth Soho after CT. We will schedule you for a sleep study in the sleep lab today. We will call you with the results. Please contact office for sooner follow up if symptoms do not improve or worsen or seek emergency care.

## 2016-07-25 NOTE — Progress Notes (Signed)
Radiation Oncology         [phone]) [phone] ________________________________  Name: Toni Moore MRN: [id number]  Date: 07/25/2016  DOB: Apr 06, 1956  Follow-Up Visit Note  CC: Toni Aly, FNP  Toni Aly, FNP  Diagnosis:   T1N0 non-small cell lung cancer of the left upper lung    ICD-9-CM ICD-10-CM   1. Primary cancer of left upper lobe of lung (HCC) 162.3 C34.12 CT Chest Wo Contrast    Interval Since Last Radiation:  10 months, 09/08/2015-09/15/2015   Narrative:  The patient returns today for routine follow-up. Patient denies pain. Reports SOB with exertion. Reports cough has resolved. Denies pain or difficulty when swallowing. Rash noted on upper chest and back. Reports when she scratches this rash clear fluid comes out. She reports the rash is very itchy. Reports applying neosporin to affected skin. Reports gradual improvement in energy level. Scheduled to see Dr. Elsworth Soho today following this appointment.                       ALLERGIES:  is allergic to asa [aspirin].  Meds: Current Outpatient Prescriptions  Medication Sig Dispense Refill  . albuterol (VENTOLIN HFA) 108 (90 BASE) MCG/ACT inhaler INHALE ONE PUFF BY MOUTH EVERY SIX HOURS AS NEEDED FOR WHEEZING    . Calcium Carbonate-Vitamin D 600-125 MG-UNIT TABS Take by mouth.    . cholecalciferol (VITAMIN D) 1000 UNITS tablet Take 5,000 Units by mouth every other day.     . fluticasone furoate-vilanterol (BREO ELLIPTA) 100-25 MCG/INH AEPB Inhale 1 puff into the lungs daily. 180 each 1  . simvastatin (ZOCOR) 20 MG tablet TAKE ONE TABLET BY MOUTH AT BEDTIME    . tiotropium (SPIRIVA HANDIHALER) 18 MCG inhalation capsule INHALE ONE CAPSULE BY MOUTH ONE TIME DAILY     No current facility-administered medications for this encounter.     Physical Findings: The patient is in no acute distress. Patient is alert and oriented.  weight is 195 lb 9.6 oz (88.7 kg). Her oral temperature is 98 F (36.7 C). Her blood pressure is 146/78  (abnormal) and her pulse is 98. Her respiration is 16 and oxygen saturation is 90%. .  No significant changes.  Lab Findings: Lab Results  Component Value Date   WBC 4.1 12/09/2009   WBC 3.5 (L) 09/04/2009   HGB 14.6 12/09/2009   HCT 42.2 12/09/2009   PLT 115 (L) 12/09/2009    Lab Results  Component Value Date   NA 142 06/23/2009   K 4.2 06/23/2009   CO2 29 06/23/2009   GLUCOSE 125 (H) 06/23/2009   BUN 10 06/23/2009   CREATININE 1.01 06/23/2009   BILITOT 1.2 06/23/2009   ALKPHOS 61 06/23/2009   AST 20 06/23/2009   ALT 15 06/23/2009   PROT 6.6 06/23/2009   ALBUMIN 4.4 06/23/2009   CALCIUM 9.5 06/23/2009    Radiographic Findings: Ct Chest Wo Contrast  Result Date: 07/19/2016 CLINICAL DATA:  Completion of SBRT in October 2016 for presumed left upper lobe lung cancer. Restaging. EXAM: CT CHEST WITHOUT CONTRAST TECHNIQUE: Multidetector CT imaging of the chest was performed following the standard protocol without IV contrast. COMPARISON:  01/08/2016 chest CT. FINDINGS: Mediastinum/Nodes: Normal heart size. No significant pericardial fluid/thickening. Left anterior descending, left circumflex and right coronary atherosclerosis. Atherosclerotic nonaneurysmal thoracic aorta. Normal caliber pulmonary arteries. No discrete thyroid nodules. Unremarkable esophagus. No pathologically enlarged axillary, mediastinal or gross hilar lymph nodes, noting limited sensitivity for the detection of hilar adenopathy on this noncontrast study. Lungs/Pleura:  No pneumothorax. No pleural effusion. Moderate to severe centrilobular emphysema and diffuse bronchial wall thickening. Bandlike opacities in the posterior lung apices are stable and most consistent with pleural-parenchymal scarring. Anterior right lower lobe 4 mm solid pulmonary nodule (series 5/ image 91) is stable since 2009 and considered benign. Posterior right lower lobe 4 mm pulmonary nodule (series 5/ image 85) is stable since 2009 and considered  benign. Superior segment right lower lobe 4 mm solid pulmonary nodule (series 5/ image 72) is decreased from 7 mm on 01/08/2016, suggesting a benign lesion. New 3 mm peripheral right lower lobe pulmonary nodule with ill-defined margins (series 5/ image 135). Posterior left upper lobe 1.2 x 0.9 cm pulmonary nodule (series 5/ image 40), previously 1.2 x 0.8 cm using similar measurement technique, not appreciably changed. New mild patchy ground-glass opacity and septal thickening surrounding the posterior left lower lobe pulmonary nodule, favor evolving postradiation change. Stable bandlike opacity in the peripheral left lower lobe adjacent to healed posterior left tenth rib deformity, consistent with posttraumatic scarring. Additional tiny 2-3 mm posterior left lower lobe pulmonary nodules (series 5/images 90 and 116) are stable since 2009 and are considered benign. Upper abdomen: Liver surface is diffusely irregular and lateral segment left lower lobe is hypertrophied, consistent with cirrhosis. Partially visualize simple appearing 2.7 cm upper left renal cyst. Musculoskeletal: No aggressive appearing focal osseous lesions. Mild thoracic spondylosis. IMPRESSION: 1. Posterior left upper lobe pulmonary nodule is stable. Evolving mild postradiation change surrounding this nodule. 2. New tiny 3 mm right lower lobe pulmonary nodule, nonspecific. Recommend attention on follow-up chest CT in 3-6 months. 3. No thoracic adenopathy. 4. Additional findings include aortic atherosclerosis, three-vessel coronary atherosclerosis, moderate to severe emphysema and cirrhosis. Electronically Signed   By: Ilona Sorrel M.D.   On: 07/19/2016 17:09    Impression:  No evidence of recurrence. No new findings on CT scan.   Plan:  CT scan and follow up in 6 months  _____________________________________  Sheral Apley. Tammi Klippel, M.D.  This document serves as a record of services personally performed by Tyler Pita, MD. It was created  on his behalf by Bethann Humble, a trained medical scribe. The creation of this record is based on the scribe's personal observations and the provider's statements to them. This document has been checked and approved by the attending provider.

## 2016-07-25 NOTE — Progress Notes (Signed)
Returns to review 8/22 CT of chest. Weight and vitals stable. Denies pain. Reports SOB with exertion. Reports cough has resolved. Denies pain or difficulty associated with swallowing. Rash noted upper chest and back. Reports when she scratches this rash clear fluid comes out. She reports the rash is very itchy. Reports applying neosporin to affected skin. Reports a gradual improvement in energy level. Scheduled to see Dr. Elsworth Soho today following this appointment.   BP (!) 146/78 (BP Location: Left Arm, Patient Position: Sitting, Cuff Size: Normal)   Pulse 98   Temp 98 F (36.7 C) (Oral)   Resp 16   Wt 195 lb 9.6 oz (88.7 kg)   SpO2 90%   BMI 28.89 kg/m  Wt Readings from Last 3 Encounters:  07/25/16 195 lb 9.6 oz (88.7 kg)  01/12/16 190 lb 9.6 oz (86.5 kg)  10/28/15 190 lb 3.2 oz (86.3 kg)

## 2016-07-25 NOTE — Progress Notes (Signed)
History of Present Illness Toni Moore is a 60 y.o. female former smoker with COPD and pulmonary nodule requiring SBRT in 2015-2016. ( Completed 09/2015) She is followed by Dr. Elsworth Soho for continued surveillance of pulmonary nodules and COPD.   07/25/2016 Pt. Presents to the office today for follow up of COPD and results of CT Scan for follow up of pulmonary nodules.CT indicates stable left upper lobe nodule with some post radiation changes. In addition there is a small nodule that will require a follow up CT in 6 months. She feels her COPD is stable. Last flare was in June 2017, which was treated with prednisone taper by her PCP. She is compliant with her [employer] [facility]  And Spiriva once daily. She uses her rescue inhaler about 2-3 times daily. She states she has to use it more with the heat. She does have a hoarse voice today. She went to Dr. Constance Holster, who did a , she has a cyst on her throat, which is benign and does not require treatment as long as she does not mind her hoarse voice. She denies fever, chest pain, orthopnea or hemoptysis.She states that she does have snoring at night. We will evaluate her for OSA.  Tests CT Chest 07/19/2016 IMPRESSION: 1. Posterior left upper lobe pulmonary nodule is stable. Evolving mild postradiation change surrounding this nodule. 2. New tiny 3 mm right lower lobe pulmonary nodule, nonspecific. Recommend attention on follow-up chest CT in 3-6 months. 3. No thoracic adenopathy. 4. Additional findings include aortic atherosclerosis, three-vessel coronary atherosclerosis, moderate to severe emphysema and cirrhosis  Past medical hx Past Medical History:  Diagnosis Date  . COPD (chronic obstructive pulmonary disease) ([place])   . Hyperlipidemia   . Thrombocytopenia ([place])   . Unspecified chronic bronchitis ([place])   . Vitamin D deficiency      Past surgical hx, Family hx, Social hx all reviewed.  Current Outpatient Prescriptions on File Prior to Visit  Medication Sig   . albuterol (VENTOLIN HFA) 108 (90 BASE) MCG/ACT inhaler INHALE ONE PUFF BY MOUTH EVERY SIX HOURS AS NEEDED FOR WHEEZING  . Calcium Carbonate-Vitamin D 600-125 MG-UNIT TABS Take by mouth.  . cholecalciferol (VITAMIN D) 1000 UNITS tablet Take 5,000 Units by mouth every other day.   . fluticasone furoate-vilanterol (BREO ELLIPTA) 100-25 MCG/INH AEPB Inhale 1 puff into the lungs daily.  . simvastatin (ZOCOR) 20 MG tablet TAKE ONE TABLET BY MOUTH AT BEDTIME  . tiotropium (SPIRIVA HANDIHALER) 18 MCG inhalation capsule INHALE ONE CAPSULE BY MOUTH ONE TIME DAILY   No current facility-administered medications on file prior to visit.      Allergies  Allergen Reactions  . Asa [Aspirin]     Bleeds non-stop    Review Of Systems:  Constitutional:   No  weight loss, night sweats,  Fevers, chills, fatigue, or  lassitude.  HEENT:   No headaches,  Difficulty swallowing,  Tooth/dental problems, or  Sore throat,                No sneezing, itching, ear ache, nasal congestion, post nasal drip,   CV:  No chest pain,  Orthopnea, PND, +swelling in lower extremities, anasarca, dizziness, palpitations, syncope.   GI  No heartburn, indigestion, abdominal pain, nausea, vomiting, diarrhea, change in bowel habits, loss of appetite, bloody stools.   Resp: + shortness of breath with exertion less  at rest.  No excess mucus, no productive cough,  No non-productive cough,  No coughing up of blood.  No change in  color of mucus.  No wheezing.  No chest wall deformity  Skin: no rash or lesions.  GU: no dysuria, change in color of urine, no urgency or frequency.  No flank pain, no hematuria   MS:  No joint pain or swelling.  No decreased range of motion.  No back pain.  Psych:  No change in mood or affect. No depression or anxiety.  No memory loss.   Vital Signs BP 122/82 (BP Location: Left Arm, Cuff Size: Normal)   Pulse 99   Ht '5\' 9"'$  (1.753 m)   Wt 195 lb 3.2 oz (88.5 kg)   SpO2 93%   BMI 28.83 kg/m     Physical Exam:  General- No distress,  A&Ox3, pleasant ENT: No sinus tenderness, TM clear, pale nasal mucosa, no oral exudate,no post nasal drip, no LAN Cardiac: S1, S2, regular rate and rhythm, no murmur Chest: No wheeze/ rales/ dullness; no accessory muscle use, no nasal flaring, no sternal retractions Abd.: Soft Non-tender Ext: No clubbing cyanosis, 2+ edema bilateral lower extremity edema. Neuro:  normal strength Skin: No rashes, warm and dry Psych: normal mood and behavior   Assessment/Plan  COPD (chronic obstructive pulmonary disease) Stable COPD Plan: Continue wearing your nocturnal oxygen as you have been doing. Continue taking your Breo 1 puff once daily.  Rinse your mouth after use. Continue your Spiriva one tablet daily. We will schedule you for a CT in 6 months with appointment with Dr. Elsworth Soho after CT. We will schedule you for a sleep study in the sleep lab today. We will call you with the results. Please contact office for sooner follow up if symptoms do not improve or worsen or seek emergency care.       Magdalen Spatz, NP 07/25/2016  4:17 PM    She is here for follow-up of COPD and pulmonary nodules. She has some mild pedal edema which she states is long standing, she is compliant with her nocturnal oxygen. Lungs are clear on exam. We reviewed her CT scan from 06/2016 which shows stable left upper lobe nodule post radiotherapy, other nodules are also stable and likely benign. There is a new 3 mm nodule which is too early to comment. I reviewed her nocturnal oximetry and this shows sawtooth pattern which raises the question of OSA-she does report loud snoring and unrefreshing sleep-hence we will proceed with a split attended polysomnogram. She has seen ENT for hoarseness and this has been attributed to assist, she will continue on Breo and Spiriva with nocturnal oxygen  She reminds me that  She would like to continue to work and does not want daytime  oxygen   Rigoberto Noel. MD

## 2016-07-26 NOTE — Addendum Note (Signed)
Encounter addended by: Heywood Footman, RN on: 07/26/2016 11:25 AM<BR>    Actions taken: Charge Capture section accepted

## 2016-09-11 ENCOUNTER — Ambulatory Visit (HOSPITAL_BASED_OUTPATIENT_CLINIC_OR_DEPARTMENT_OTHER): Payer: Managed Care, Other (non HMO) | Attending: Pulmonary Disease | Admitting: Pulmonary Disease

## 2016-09-11 VITALS — Ht 69.0 in | Wt 195.0 lb

## 2016-09-11 DIAGNOSIS — G4736 Sleep related hypoventilation in conditions classified elsewhere: Secondary | ICD-10-CM | POA: Diagnosis not present

## 2016-09-11 DIAGNOSIS — R0683 Snoring: Secondary | ICD-10-CM

## 2016-09-11 DIAGNOSIS — J449 Chronic obstructive pulmonary disease, unspecified: Secondary | ICD-10-CM | POA: Diagnosis not present

## 2016-09-16 ENCOUNTER — Telehealth: Payer: Self-pay | Admitting: Pulmonary Disease

## 2016-09-16 DIAGNOSIS — J9691 Respiratory failure, unspecified with hypoxia: Secondary | ICD-10-CM | POA: Diagnosis not present

## 2016-09-16 NOTE — Telephone Encounter (Signed)
She did not sleep much during her sleep study No sig OSA noted O2 did drop -Stay on o2 during sleep

## 2016-09-16 NOTE — Procedures (Signed)
Patient Name: Toni Moore, Toni Moore Date: 09/11/2016 Gender: Female D.O.B: Nov 11, 1956 Age (years): 52 Referring Provider: Kara Mead MD, ABSM Height (inches): 69 Interpreting Physician: Kara Mead MD, ABSM Weight (lbs): 195 RPSGT: Zadie Rhine BMI: 29 MRN: [id number] Neck Size: 16.00   CLINICAL INFORMATION Sleep Study Type: NPSG Indication for sleep study: Snoring Epworth Sleepiness Score: 8    SLEEP STUDY TECHNIQUE As per the AASM Manual for the Scoring of Sleep and Associated Events v2.3 (April 2016) with a hypopnea requiring 4% desaturations. The channels recorded and monitored were frontal, central and occipital EEG, electrooculogram (EOG), submentalis EMG (chin), nasal and oral airflow, thoracic and abdominal wall motion, anterior tibialis EMG, snore microphone, electrocardiogram, and pulse oximetry.   MEDICATIONS Medications self-administered by patient taken the night of the study : SIMVASTATIN    SLEEP ARCHITECTURE The study was initiated at 10:47:30 PM and ended at 4:51:23 AM. Sleep onset time was 27.8 minutes and the sleep efficiency was 15.8%. The total sleep time was 57.5 minutes. Stage REM latency was N/A minutes. The patient spent 37.39% of the night in stage N1 sleep, 62.61% in stage N2 sleep, 0.00% in stage N3 and 0.00% in REM. Alpha intrusion was absent. Supine sleep was 40.87%.    RESPIRATORY PARAMETERS The overall apnea/hypopnea index (AHI) was 3.1 per hour. There were 1 total apneas, including 1 obstructive, 0 central and 0 mixed apneas. There were 2 hypopneas and 20 RERAs. The AHI during Stage REM sleep was N/A per hour. AHI while supine was 5.1 per hour. The mean oxygen saturation was 92.40%. The minimum SpO2 during sleep was 83.00%. snoring was noted during this study.    CARDIAC DATA The 2 lead EKG demonstrated sinus rhythm. The mean heart rate was 73.11 beats per minute. Other EKG findings include: None.   LEG MOVEMENT DATA The total  PLMS were 0 with a resulting PLMS index of 0.00. Associated arousal with leg movement index was 0.0 .   IMPRESSIONS - No significant obstructive sleep apnea occurred during this study (AHI = 3.1/h). - No significant central sleep apnea occurred during this study (CAI = 0.0/h). - Moderate oxygen desaturation was noted during this study (Min O2 = 83.00%). - No snoring was audible during this study. - No cardiac abnormalities were noted during this study. - Clinically significant periodic limb movements did not occur during sleep. No significant associated arousals. - Difficulty initiating & maintaining sleep    DIAGNOSIS - Nocturnal Hypoxemia (327.26 [G47.36 ICD-10]) related to underlying COPD    RECOMMENDATIONS - Avoid alcohol, sedatives and other CNS depressants that may worsen sleep apnea and disrupt normal sleep architecture. - Sleep hygiene should be reviewed to assess factors that may improve sleep quality. - Weight management and regular exercise should be initiated or continued if appropriate. -2L o2 & recheck oximetry    Kara Mead MD Board Certified in [place]

## 2016-09-26 NOTE — Telephone Encounter (Signed)
Patient returned call, [phone].

## 2016-09-26 NOTE — Telephone Encounter (Signed)
Pt aware of results. Pt voiced understanding and had no further questions. Nothing further needed.

## 2016-09-26 NOTE — Telephone Encounter (Signed)
LMTC x 1  

## 2016-11-15 DIAGNOSIS — R030 Elevated blood-pressure reading, without diagnosis of hypertension: Secondary | ICD-10-CM | POA: Insufficient documentation

## 2016-11-29 ENCOUNTER — Telehealth: Payer: Self-pay | Admitting: Pulmonary Disease

## 2016-11-29 MED ORDER — OSELTAMIVIR PHOSPHATE 75 MG PO CAPS: 75.0000 mg | ORAL_CAPSULE | Freq: Two times a day (BID) | ORAL | 0 refills | Status: DC

## 2016-11-29 MED ORDER — BENZONATATE 200 MG PO CAPS: 200.0000 mg | ORAL_CAPSULE | Freq: Three times a day (TID) | ORAL | 0 refills | Status: DC | PRN

## 2016-11-29 NOTE — Telephone Encounter (Signed)
Sounds like flu. Offer Tamiflu 75 mg, # 10,   1 twice daily            Benzonatate 200 mg, # 30, 1 every 8 hours if needed for cough            Stay well hydrated and rested

## 2016-11-29 NOTE — Telephone Encounter (Signed)
CY  Please Advise-Sick Message  This is a RA pt., pt. Called in today c/o sob,wheezing,coughing but not able to produce anything,sore throat,fever,body chills, denies chest pain and chest tightness. Would like to know if something could be called in.   Allergies  Allergen Reactions  . Asa [Aspirin]     Bleeds non-stop

## 2016-11-29 NOTE — Telephone Encounter (Signed)
Spoke with pt. And informed her of CY recc. Pt. Agreed to the medication. The medication was sent to her pharmacy of choice. Nothing further is needed at this time.

## 2016-12-22 ENCOUNTER — Telehealth: Payer: Self-pay | Admitting: *Deleted

## 2016-12-22 NOTE — Telephone Encounter (Signed)
CALLED PATIENT TO INFORM OF CT ON 01/24/17- ARRIVAL TIME - 8:45 AM @ [facility], NO RESTRICTIONS TO TEST, ORDERED WITHOUT CONTRAST, LVM FOR A RETURN CALL

## 2017-01-16 ENCOUNTER — Telehealth: Payer: Self-pay | Admitting: *Deleted

## 2017-01-16 NOTE — Telephone Encounter (Signed)
CALLED PATIENT TO INFORM THAT SCAN HAS BEEN MOVED TO 01-19-17- ARRIVAL TIME - 8:45 AM @ [facility], SPOKE WITH PATIENT AND SHE IS AWARE OF THIS TEST CHANGE, TEST CHANGE PER PATIENT REQUEST.

## 2017-01-18 NOTE — Progress Notes (Addendum)
Toni Moore 61 y.o. woman with T1N0 non-small cell lung cancer of the left upper lung completed radiation 09-15-15 review 01-19-17 CT chest wo contrast 6 month FU.  Weight changes, if any: Wt Readings from Last 3 Encounters:  01/25/17 192 lb 6.4 oz (87.3 kg)  09/11/16 195 lb (88.5 kg)  07/25/16 195 lb 9.6 oz (88.7 kg)   Respiratory complaints, if any: SOB on O 2 2 L/M N/C, coughing yellowish secretion and wheezing occasional, had the flu January 11-28-16 Hemoptysis, if any: None  Swallowing Problems/Pain/Difficulty swallowing:None Appetite :Good eating two meals per day with snacks Pain:None When is next chemo scheduled?: None Lab work from of chart:None Imaging:01-19-17 CT chest wo contrast BP (!) 146/94   Pulse 98   Temp 97.9 F (36.6 C) (Oral)   Resp 18   Ht '5\' 9"'$  (1.753 m)   Wt 192 lb 6.4 oz (87.3 kg)   SpO2 97%   BMI 28.41 kg/m

## 2017-01-19 ENCOUNTER — Ambulatory Visit (HOSPITAL_BASED_OUTPATIENT_CLINIC_OR_DEPARTMENT_OTHER)
Admission: RE | Admit: 2017-01-19 | Discharge: 2017-01-19 | Disposition: A | Discharge status: Home / Self Care | Payer: Managed Care, Other (non HMO) | Source: Ambulatory Visit | Attending: Radiation Oncology | Admitting: Radiation Oncology

## 2017-01-19 DIAGNOSIS — K7460 Unspecified cirrhosis of liver: Secondary | ICD-10-CM | POA: Diagnosis not present

## 2017-01-19 DIAGNOSIS — I2510 Atherosclerotic heart disease of native coronary artery without angina pectoris: Secondary | ICD-10-CM | POA: Diagnosis not present

## 2017-01-19 DIAGNOSIS — I700 Atherosclerosis of aorta: Secondary | ICD-10-CM | POA: Insufficient documentation

## 2017-01-19 DIAGNOSIS — J439 Emphysema, unspecified: Secondary | ICD-10-CM | POA: Diagnosis not present

## 2017-01-19 DIAGNOSIS — Z85118 Personal history of other malignant neoplasm of bronchus and lung: Secondary | ICD-10-CM | POA: Insufficient documentation

## 2017-01-19 DIAGNOSIS — M47814 Spondylosis without myelopathy or radiculopathy, thoracic region: Secondary | ICD-10-CM | POA: Insufficient documentation

## 2017-01-19 DIAGNOSIS — Z923 Personal history of irradiation: Secondary | ICD-10-CM | POA: Diagnosis not present

## 2017-01-19 DIAGNOSIS — C3412 Malignant neoplasm of upper lobe, left bronchus or lung: Secondary | ICD-10-CM

## 2017-01-20 ENCOUNTER — Other Ambulatory Visit: Payer: Self-pay | Admitting: Pulmonary Disease

## 2017-01-23 ENCOUNTER — Telehealth: Payer: Self-pay | Admitting: Radiation Oncology

## 2017-01-23 NOTE — Telephone Encounter (Signed)
LM for the patient to return my call to discuss CT imaging.

## 2017-01-23 NOTE — Telephone Encounter (Signed)
-----   Message from Tyler Pita, MD sent at 01/22/2017 11:53 AM EST ----- I reviewed images.  This could represent scarring, however, I agree a re-staging PET may be reasonable

## 2017-01-24 ENCOUNTER — Ambulatory Visit (HOSPITAL_COMMUNITY): Payer: Managed Care, Other (non HMO)

## 2017-01-25 ENCOUNTER — Encounter: Payer: Self-pay | Admitting: Radiation Oncology

## 2017-01-25 ENCOUNTER — Ambulatory Visit
Admission: RE | Admit: 2017-01-25 | Discharge: 2017-01-25 | Disposition: A | Discharge status: Home / Self Care | Payer: Managed Care, Other (non HMO) | Source: Ambulatory Visit | Attending: Radiation Oncology | Admitting: Radiation Oncology

## 2017-01-25 VITALS — BP 146/94 | HR 98 | Temp 97.9°F | Resp 18 | Ht 69.0 in | Wt 192.4 lb

## 2017-01-25 DIAGNOSIS — Z923 Personal history of irradiation: Secondary | ICD-10-CM | POA: Diagnosis not present

## 2017-01-25 DIAGNOSIS — I700 Atherosclerosis of aorta: Secondary | ICD-10-CM | POA: Insufficient documentation

## 2017-01-25 DIAGNOSIS — C3412 Malignant neoplasm of upper lobe, left bronchus or lung: Secondary | ICD-10-CM | POA: Diagnosis present

## 2017-01-25 DIAGNOSIS — I2510 Atherosclerotic heart disease of native coronary artery without angina pectoris: Secondary | ICD-10-CM | POA: Insufficient documentation

## 2017-01-25 DIAGNOSIS — J439 Emphysema, unspecified: Secondary | ICD-10-CM | POA: Insufficient documentation

## 2017-01-25 NOTE — Addendum Note (Signed)
Encounter addended by: Malena Edman, RN on: 01/25/2017  2:10 PM<BR>    Actions taken: Charge Capture section accepted

## 2017-01-25 NOTE — Progress Notes (Signed)
Radiation Oncology         [phone] ________________________________  Name: Toni Moore MRN: [id number]  Date: 01/25/2017  DOB: [birth year]  Follow-Up Visit Note  CC: Vicenta Aly, FNP  Vicenta Aly, FNP  Diagnosis:   T1N0 non-small cell lung cancer of the left upper lung    ICD-9-CM ICD-10-CM   1. Primary cancer of left upper lobe of lung (HCC) 162.3 C34.12     Interval Since Last Radiation:  1 year, 4 months s/p SBRT LUL lesion 09/08/2015-09/15/2015   Narrative:  The patient returns today for routine follow-up. Patient denies pain. Reports chronic SOB with exertion and wheezing which is unchanged recently. Reports mild residual cough which is improving following recent diagnosis of influenza. Denies pain or difficulty when swallowing.  She reports good appetite and improved energy level. Denies hemoptysis. Scheduled to see Dr. Elsworth Soho for routine follow up 01/30/17.                       ALLERGIES:  is allergic to asa [aspirin].  Meds: Current Outpatient Prescriptions  Medication Sig Dispense Refill  . albuterol (VENTOLIN HFA) 108 (90 BASE) MCG/ACT inhaler INHALE ONE PUFF BY MOUTH EVERY SIX HOURS AS NEEDED FOR WHEEZING    . BREO ELLIPTA 100-25 MCG/INH AEPB USE 1 INHALATION DAILY 360 each 1  . Calcium Carbonate-Vitamin D 600-125 MG-UNIT TABS Take by mouth.    . cholecalciferol (VITAMIN D) 1000 UNITS tablet Take 5,000 Units by mouth every other day.     . simvastatin (ZOCOR) 20 MG tablet TAKE ONE TABLET BY MOUTH AT BEDTIME    . tiotropium (SPIRIVA HANDIHALER) 18 MCG inhalation capsule INHALE ONE CAPSULE BY MOUTH ONE TIME DAILY     No current facility-administered medications for this encounter.     Physical Findings:  height is '5\' 9"'$  (1.753 m) and weight is 192 lb 6.4 oz (87.3 kg). Her oral temperature is 97.9 F (36.6 C). Her blood pressure is 146/94 (abnormal) and her pulse is 98. Her respiration is 18 and oxygen saturation is 97%. .  In general this is a well  appearing caucasian female in no acute distress. She's alert and oriented x4 and appropriate throughout the examination. Neck and supraclavicular regions are free of adenopathy. Lungs are clear to auscultation. Heart is regular rate and rhythm without murmurs. Extremities are free of cyanosis clubbing and edema. Speech is fluent articulate. The patient's mood and affect are appropriate.  Lab Findings: Lab Results  Component Value Date   WBC 4.1 12/09/2009   WBC 3.5 (L) 09/04/2009   HGB 14.6 12/09/2009   HCT 42.2 12/09/2009   PLT 115 (L) 12/09/2009    Lab Results  Component Value Date   NA 142 06/23/2009   K 4.2 06/23/2009   CO2 29 06/23/2009   GLUCOSE 125 (H) 06/23/2009   BUN 10 06/23/2009   CREATININE 1.01 06/23/2009   BILITOT 1.2 06/23/2009   ALKPHOS 61 06/23/2009   AST 20 06/23/2009   ALT 15 06/23/2009   PROT 6.6 06/23/2009   ALBUMIN 4.4 06/23/2009   CALCIUM 9.5 06/23/2009    Radiographic Findings: Ct Chest Wo Contrast  Result Date: 01/19/2017 CLINICAL DATA:  Re- stage presumed left upper lobe bronchogenic carcinoma treated with radiation therapy completed November 2016, presenting for restaging. EXAM: CT CHEST WITHOUT CONTRAST TECHNIQUE: Multidetector CT imaging of the chest was performed following the standard protocol without IV contrast. COMPARISON:  07/19/2016 chest CT. FINDINGS: Cardiovascular: Normal heart size. Stable trace anterior  pericardial effusion/thickening. Left anterior descending, left circumflex and right coronary atherosclerosis. Atherosclerotic nonaneurysmal thoracic aorta. Normal caliber pulmonary arteries. Mediastinum/Nodes: No discrete thyroid nodules. Unremarkable esophagus. No pathologically enlarged axillary, mediastinal or gross hilar lymph nodes, noting limited sensitivity for the detection of hilar adenopathy on this noncontrast study. Lungs/Pleura: No pneumothorax. No pleural effusion. Severe centrilobular and paraseptal emphysema with diffuse  bronchial wall thickening. New peripheral right middle lobe 3 mm solid pulmonary nodule (series 3/ image 88). New dependent left lower lobe 3 mm solid pulmonary nodule (series 3/ image 139). New small subpleural patchy focus of consolidation in the medial anterior right upper lobe (series 3/ image 87). Previously described new 3 mm peripheral right lower lobe pulmonary nodule on the 07/19/2016 chest CT study is absent on this scan, consistent with a resolved inflammatory nodule. Posterior left upper lobe irregular 1.3 x 1.0 cm pulmonary nodule (series 3/ image 46), previously 1.2 x 0.9 cm on 07/19/2016 and 1.1 x 0.7 cm on 01/08/2016, suggesting slight growth. Slightly increased reticulation and bandlike opacity surrounding the posterior left upper lobe pulmonary nodule, consistent with continued evolution of radiation change. Numerous additional scattered tiny pulmonary nodules in both lungs are stable on multiple prior chest CT studies and considered benign. Upper abdomen: Diffusely irregular liver surface with relative hypertrophy of the lateral segment left liver lobe, consistent with cirrhosis. Musculoskeletal: No aggressive appearing focal osseous lesions. Mild thoracic spondylosis . IMPRESSION: 1. Slight growth of posterior left upper lobe pulmonary nodule compared to the two previous chest CT studies, now 1.3 x 1.0 cm, suggestive of early local tumor recurrence. Recommend either continued short-term chest CT surveillance or PET-CT. 2. Two new tiny 3 mm pulmonary nodules, indeterminate. New small patchy subpleural focus of consolidation in the medial right upper lobe, probably inflammatory. Recommend attention to these findings on follow-up chest CT in 3 months. 3. Previously described new tiny 3 mm right lower lobe pulmonary nodule has resolved. 4. No thoracic adenopathy. 5. Severe emphysema diffuse bronchial wall thickening, suggesting COPD. 6. Aortic atherosclerosis.  Three-vessel coronary atherosclerosis.  7. Cirrhosis. Electronically Signed   By: Ilona Sorrel M.D.   On: 01/19/2017 12:43    Impression/Plan:  T1N0 non-small cell lung cancer of the left upper lung. On recent CT chest imaging, there is slight growth of a posterior left upper lobe pulmonary nodule compared to the two previous chest CT studies, now 1.3 x 1.0 cm, suggestive of early local tumor recurrence. I have reviewed the images with the patient and we discussed the findings in detail.  I presented her with the options of proceeding with further evaluation of the lesion with PET CT to determine the metabolic activity to help guide our treatment recommendations versus waiting and repeating CT chest imaging in 3 months.  The patient wishes to wait and re-image in 3 months.  She has a follow up appointment with Dr. Elsworth Soho on 01/30/17 and would like to solicit his opinion on this as well. She knows that if she changes her mind and wants to move forward with a PET scan, she can call our office and we will schedule accordingly.  Otherwise, we will plan to see her back with CT scan and follow up in 3 months.  Nicholos Johns, PA-C

## 2017-01-30 ENCOUNTER — Ambulatory Visit (INDEPENDENT_AMBULATORY_CARE_PROVIDER_SITE_OTHER): Payer: Managed Care, Other (non HMO) | Admitting: Pulmonary Disease

## 2017-01-30 ENCOUNTER — Telehealth: Payer: Self-pay

## 2017-01-30 ENCOUNTER — Encounter: Payer: Self-pay | Admitting: Pulmonary Disease

## 2017-01-30 ENCOUNTER — Other Ambulatory Visit: Payer: Self-pay

## 2017-01-30 DIAGNOSIS — C3412 Malignant neoplasm of upper lobe, left bronchus or lung: Secondary | ICD-10-CM

## 2017-01-30 DIAGNOSIS — K7460 Unspecified cirrhosis of liver: Secondary | ICD-10-CM | POA: Diagnosis not present

## 2017-01-30 DIAGNOSIS — J439 Emphysema, unspecified: Secondary | ICD-10-CM | POA: Diagnosis not present

## 2017-01-30 DIAGNOSIS — J9611 Chronic respiratory failure with hypoxia: Secondary | ICD-10-CM | POA: Diagnosis not present

## 2017-01-30 NOTE — Assessment & Plan Note (Signed)
There has been increase in size of left upper lobe scarring by 2 mm in the last year- unclear whether this is increased scarring or recurrence of malignancy  Alternatives include repeat CT scan short-term 3 months or PET scan-will discuss with the radiologist, I would favor repeating PET scan in 3 months

## 2017-01-30 NOTE — Telephone Encounter (Signed)
Spoke with patient about PET scan. Patient agreed and verbalized understanding about scan. Had no further questions.   Order has been placed.

## 2017-01-30 NOTE — Assessment & Plan Note (Signed)
Stay on nocturnal oxygen

## 2017-01-30 NOTE — Assessment & Plan Note (Signed)
  Stay on Breo and Spiriva

## 2017-01-30 NOTE — Assessment & Plan Note (Addendum)
You may need a referral for a gastroenterologist for the finding of cirrhosis  Greater than 50% time was spent in counseling and discussing findings on the CT scan

## 2017-01-30 NOTE — Telephone Encounter (Signed)
-----   Message from Rigoberto Noel, MD sent at 01/30/2017  3:44 PM EST ----- Pl let pt lknow - I d/w dr Tammi Klippel & order PET scan in 3 mnths  ----- Message ----- From: Tyler Pita, MD Sent: 01/30/2017  10:46 AM To: Hayden Pedro, PA-C, Rigoberto Noel, MD  Sounds good.  I saw that and agree PET would be helpful.  Hopefully just radiation fibrosis.   ----- Message ----- From: Rigoberto Noel, MD Sent: 01/30/2017  10:30 AM To: Tyler Pita, MD  Matt, Left upper lobe scarring has increased slightly compared to February 2017 If okay with you, we'll order PET scan in 3 months  Davonna Belling
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
## 2017-01-30 NOTE — Patient Instructions (Addendum)
I will discuss with Dr. Tammi Klippel and order the imaging study follow-up  You may need a referral for a gastroenterologist for the finding of cirrhosis  You may need to see her cardiologist for atherosclerosis noted on the CT scan  Stay on Breo and Spiriva

## 2017-02-01 ENCOUNTER — Telehealth: Payer: Self-pay | Admitting: *Deleted

## 2017-02-01 NOTE — Telephone Encounter (Signed)
CALLED PATIENT TO ASK ABOUT COMING TO SEE ALISON PERKINS ON  05-04-17 @ 1:30 PM, PT. AGREED TO THIS FU

## 2017-02-21 ENCOUNTER — Other Ambulatory Visit: Payer: Self-pay | Admitting: Gastroenterology

## 2017-02-21 DIAGNOSIS — R9389 Abnormal findings on diagnostic imaging of other specified body structures: Secondary | ICD-10-CM

## 2017-02-24 IMAGING — PT NM PET TUM IMG INITIAL (PI) SKULL BASE T - THIGH
1 of 9 series · 1 of 25 positions shown · non-contrast
Comparison: 08/21/2014 PET-CT. 06/26/2015 and 11/26/2014 chest CT
studies.

CLINICAL DATA: Initial treatment strategy for enlarging left upper
lobe pulmonary nodule.

EXAM:
NUCLEAR MEDICINE PET SKULL BASE TO THIGH
TECHNIQUE: 9.2 mCi F-18 FDG was injected intravenously. Full-ring PET imaging
was performed from the skull base to thigh after the radiotracer. CT
data was obtained and used for attenuation correction and anatomic
localization.
FASTING BLOOD GLUCOSE:  Value: 101 mg/dl

[Series 4: ct sk_thigh 5.0 b31f · axial · 5.0mm · 0.98mm/px · 1 of 225 slices shown]
[im 225/225  brain]
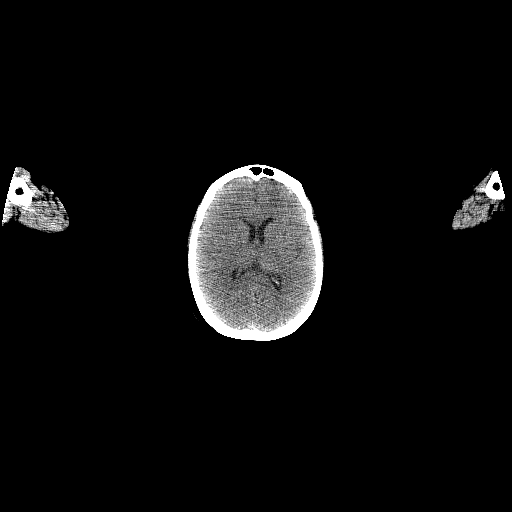

[1 of 25 positions shown; findings below may reference images not displayed]

FINDINGS: NECK

There is asymmetric hypermetabolism (max SUV 5.5) with associated
asymmetric soft tissue prominence in the left oropharynx lateral to
the uvula (series 4/ image 25), cannot exclude an epithelial
neoplasm in this location.

There is a hypermetabolic 0.7 cm short axis right level IIa lymph
node ([DATE]) with max SUV 3.0. There is a hypermetabolic 1.0 cm short
axis left level IIa lymph node ([DATE]) with max SUV 4.1. No
additional enlarged or hypermetabolic neck lymph nodes. There is
stable chronic near complete opacification of the left sphenoid
sinus.

CHEST

Hypermetabolic posterior left upper lobe 1.5 x 1.0 cm nodule (series
8/ image 22) with max SUV 4.6, increased in size and metabolism
compared to the 08/21/2014 PET-CT where it measured 0.6 x 0.6 cm
with max SUV 1.8, and with associated tenting of the adjacent major
fissure and slight surrounding spiculation, in keeping with an
enlarging primary bronchogenic carcinoma.

There is a hypermetabolic 1.1 x 1.0 cm ground-glass pulmonary nodule
in the right lower lobe (series 8/image 57) with max SUV 2.0, which
is new compared to the 06/26/2015 chest CT.

Stable non FDG avid biapical nodular pleural parenchymal scarring.
In the anterior right lower lobe 4 mm solid pulmonary nodule (8/46)
is not appreciably changed since 12/05/2007, in keeping with a
benign nodule. Subpleural 1.1 x 0.9 cm nodule in the dependent left
lower lobe with adjacent healed rib deformity and minimal uptake
(SUV max 1.7) is not appreciably changed in size since 08/21/14 and
is minimally increased compared to the 7114 CT where it measured
x 0.6 mm, most in keeping with benign scarring. A subpleural 0.3 cm
left lower lobe pulmonary nodule (8/40) is stable since 12/05/2007,
and benign. No additional significant pulmonary nodules. No acute
consolidative airspace disease. Stable moderate to severe
centrilobular and paraseptal emphysema with diffuse bronchial wall
thickening.

No pathologically enlarged or hypermetabolic axillary or mediastinal
lymph nodes. No hypermetabolic hilar nodes.

Minimal pericardial fluid/thickening. Normal heart size. There is
atherosclerosis of the thoracic aorta, the great vessels of the
mediastinum and the coronary arteries, including calcified
atherosclerotic plaque in the left anterior descending and right
coronary arteries. Minimal retained secretions in the dependent
trachea.

There is asymmetric muscular hypermetabolism in the left rotator
cuff musculature.

ABDOMEN/PELVIS

No abnormal hypermetabolic activity within the liver, pancreas,
adrenal glands, or spleen. No hypermetabolic lymph nodes in the
abdomen or pelvis.

Diffusely irregular liver surface, in keeping with cirrhosis,
unchanged. No gross liver mass. Stable large calcified gallstones up
to 2.6 cm size. Simple 2.9 cm mid left renal cyst.

SKELETON

No focal hypermetabolic activity to suggest skeletal metastasis.
IMPRESSION: 1. Enlarging hypermetabolic 1.5 x 1.0 cm subpleural posterior left
upper lobe pulmonary nodule, in keeping with a primary bronchogenic
carcinoma.
2. No hypermetabolic thoracic lymphadenopathy.
3. Hypermetabolic 1.1 x 1.0 cm right lower lobe ground-glass
pulmonary nodule, which is new compared to the diagnostic chest CT
study from 14 days prior. This rapid time course and the
ground-glass density is suggestive of an inflammatory process.
Initial follow-up by chest CT without contrast is recommended in 3
months to confirm persistence. This recommendation follows the
consensus statement: Recommendations for the Management of Subsolid
Pulmonary Nodules Detected at CT: A Statement from the [facility]
4. Asymmetric hypermetabolism with associated asymmetric soft tissue
prominence in the left oropharynx lateral to the uvula. Recommend
correlation with direct visualization to exclude a primary
epithelial neoplasm in this location.
5. Hypermetabolic bilateral level IIa cervical lymphadenopathy,
nodal metastases not excluded.
6. Cirrhosis.  No metastatic disease in the abdomen or pelvis.
7. Cholelithiasis.

## 2017-03-08 ENCOUNTER — Ambulatory Visit
Admission: RE | Admit: 2017-03-08 | Discharge: 2017-03-08 | Disposition: A | Discharge status: Home / Self Care | Payer: Managed Care, Other (non HMO) | Source: Ambulatory Visit | Attending: Gastroenterology | Admitting: Gastroenterology

## 2017-03-08 DIAGNOSIS — R9389 Abnormal findings on diagnostic imaging of other specified body structures: Secondary | ICD-10-CM

## 2017-03-10 ENCOUNTER — Telehealth: Payer: Self-pay | Admitting: *Deleted

## 2017-03-10 NOTE — Telephone Encounter (Signed)
On 03-10-17 fax medical records to [facility] d[facility] ti was consult note, end of tx note, sim & planning note, follow up note,

## 2017-03-16 ENCOUNTER — Telehealth: Payer: Self-pay | Admitting: Radiation Oncology

## 2017-03-16 NOTE — Telephone Encounter (Signed)
Returned call from [place] at Dr. Fenton Foy office. Toni Moore explains this patient is in need of periodontist work and requesting clearance from Dr. Tammi Klippel. Explained the patient completed radiation a year and a half ago thus is clear to receive any necessary dental work. Encouraged clearance for nitrous oxide, sedation, etc come from Dr. Elsworth Soho (pulmonalogist) or Vicenta Aly, FNP (primary care provider). Tamar verbalized understanding of all reviewed and expressed appreciation for the return call.

## 2017-03-23 ENCOUNTER — Encounter: Payer: Self-pay | Admitting: *Deleted

## 2017-03-23 ENCOUNTER — Telehealth: Payer: Self-pay | Admitting: *Deleted

## 2017-03-23 NOTE — Telephone Encounter (Signed)
Referral from [place] to scheduling.  Prefers Location manager.

## 2017-03-31 ENCOUNTER — Telehealth: Payer: Self-pay | Admitting: *Deleted

## 2017-03-31 NOTE — Telephone Encounter (Signed)
Called patient to alter fu appt. On 05-04-17 @ 10:30 am with Ashlyn Bruning, lvm for a return call

## 2017-04-04 ENCOUNTER — Encounter: Payer: Self-pay | Admitting: Cardiovascular Disease

## 2017-04-21 ENCOUNTER — Ambulatory Visit: Payer: Managed Care, Other (non HMO) | Admitting: Cardiovascular Disease

## 2017-04-26 ENCOUNTER — Ambulatory Visit: Payer: Self-pay | Admitting: Radiation Oncology

## 2017-05-02 ENCOUNTER — Encounter (HOSPITAL_COMMUNITY): Payer: Managed Care, Other (non HMO)

## 2017-05-04 ENCOUNTER — Ambulatory Visit: Payer: Self-pay | Admitting: Urology

## 2017-05-10 ENCOUNTER — Ambulatory Visit (HOSPITAL_COMMUNITY)
Admission: RE | Admit: 2017-05-10 | Discharge: 2017-05-10 | Disposition: A | Discharge status: Home / Self Care | Payer: Managed Care, Other (non HMO) | Source: Ambulatory Visit | Attending: Pulmonary Disease | Admitting: Pulmonary Disease

## 2017-05-10 DIAGNOSIS — I700 Atherosclerosis of aorta: Secondary | ICD-10-CM | POA: Diagnosis not present

## 2017-05-10 DIAGNOSIS — I2510 Atherosclerotic heart disease of native coronary artery without angina pectoris: Secondary | ICD-10-CM | POA: Insufficient documentation

## 2017-05-10 DIAGNOSIS — C3412 Malignant neoplasm of upper lobe, left bronchus or lung: Secondary | ICD-10-CM | POA: Diagnosis not present

## 2017-05-10 DIAGNOSIS — K8020 Calculus of gallbladder without cholecystitis without obstruction: Secondary | ICD-10-CM | POA: Diagnosis not present

## 2017-05-10 DIAGNOSIS — J439 Emphysema, unspecified: Secondary | ICD-10-CM | POA: Insufficient documentation

## 2017-05-10 LAB — GLUCOSE, CAPILLARY: Glucose-Capillary: 124 mg/dL — ABNORMAL HIGH (ref 65–99)

## 2017-05-10 MED ORDER — FLUDEOXYGLUCOSE F - 18 (FDG) INJECTION
9.0000 | Freq: Once | INTRAVENOUS | Status: AC | PRN
  Administered 2017-05-10: 9 via INTRAVENOUS

## 2017-05-15 ENCOUNTER — Telehealth: Payer: Self-pay | Admitting: Pulmonary Disease

## 2017-05-15 NOTE — Telephone Encounter (Signed)
Spoke with pt, aware of results/recs.  Nothing further needed.  

## 2017-05-15 NOTE — Telephone Encounter (Signed)
Pt returning call and can be reached @ [phone].Toni Moore

## 2017-05-15 NOTE — Telephone Encounter (Signed)
lmtcb x1 for pt   Result Notes   Notes recorded by Valerie Salts, CMA on 05/12/2017 at 5:20 PM EDT Left message for patient to call back Monday morning. ------  Notes recorded by Rigoberto Noel, MD on 05/11/2017 at 12:53 PM EDT Lesion in left upper lung has reduced in size

## 2017-06-01 NOTE — Progress Notes (Signed)
Toni Moore 61 y.o. woman with T1N0 non-small cell lung cancer of the left upper lung completed radiation 09-15-15, 1 yr. 9 mo. FU.  Weight changes, if any: Wt Readings from Last 3 Encounters:  06/07/17 186 lb 6.4 oz (84.6 kg)  01/30/17 191 lb 3.2 oz (86.7 kg)  01/25/17 192 lb 6.4 oz (87.3 kg)   Respiratory complaints, if any: SOB on O 2 2 L/M N/C at night,denies coughing and wheezing Hemoptysis, if any: No Swallowing Problems/Pain/Difficulty swallowing:No issues Appetite :Good eating two meals a day with snacks Pain:None When is next chemo scheduled?: None Lab work from of chart:None Imaging:N/A BP 126/75   Pulse (!) 103   Temp 98.4 F (36.9 C) (Oral)   Resp 16   Ht 5\' 9"  (1.753 m)   Wt 186 lb 6.4 oz (84.6 kg)   SpO2 95%   BMI 27.53 kg/m

## 2017-06-07 ENCOUNTER — Ambulatory Visit: Payer: Managed Care, Other (non HMO) | Admitting: Pulmonary Disease

## 2017-06-07 ENCOUNTER — Ambulatory Visit
Admission: RE | Admit: 2017-06-07 | Discharge: 2017-06-07 | Disposition: A | Discharge status: Home / Self Care | Payer: Managed Care, Other (non HMO) | Source: Ambulatory Visit | Attending: Urology | Admitting: Urology

## 2017-06-07 VITALS — BP 126/75 | HR 103 | Temp 98.4°F | Resp 16 | Ht 69.0 in | Wt 186.4 lb

## 2017-06-07 DIAGNOSIS — C3412 Malignant neoplasm of upper lobe, left bronchus or lung: Secondary | ICD-10-CM | POA: Diagnosis present

## 2017-06-07 NOTE — Progress Notes (Signed)
Radiation Oncology         [phone]) [phone] ________________________________  Name: Toni Moore MRN: [id number]  Date: 06/07/2017  DOB: 05-Oct-1956  Follow-Up Visit Note  CC: Vicenta Aly, FNP  Vicenta Aly, FNP  Diagnosis:   T1N0 non-small cell lung cancer of the left upper lung    ICD-10-CM   1. Malignant neoplasm of upper lobe, left bronchus or lung (HCC) C34.12     Interval Since Last Radiation:  1 year, 9 months s/p SBRT LUL lesion 09/08/2015-09/15/2015   Narrative:  The patient returns today for routine follow-up. She states that she is feeling well in general.  She denies chest pain, hemoptysis, fever, chills, N/V, night sweats or weight loss. She has chronic SOB with exertion, mild cough and wheezing which is unchanged recently.  She remains on 2L O2 via [place] at night for her underlying COPD.  She denies pain or difficulty when swallowing.  She reports good appetite and energy level.   She had a recent PET scan for further evaluation of abnormal findings on CT chest from 12/2015 which suggested interval increase in size of LUL mass.  PET scan dated 05/10/17 showed interval response to therapy with a significant decrease in FDG uptake in the LUL lesion and no other concerning lesions noted.  No evidence of skeletal metastasis.  There was suggestion of possible liver cirrhosis and was referred to Dr. Juanita Craver for further evaluation.  Her liver function tests remain normal, by patient report, but abdominal ultrasound did confirm cirrhotic changes within the liver, no suspicious masses but also noted gallstones.   She is scheduled to see Dr. Elsworth Soho for routine follow up of her COPD on 09/19/17.                       ALLERGIES:  is allergic to asa [aspirin].  Meds: Current Outpatient Prescriptions  Medication Sig Dispense Refill  . albuterol (VENTOLIN HFA) 108 (90 BASE) MCG/ACT inhaler INHALE ONE PUFF BY MOUTH EVERY SIX HOURS AS NEEDED FOR WHEEZING    . BREO ELLIPTA 100-25  MCG/INH AEPB USE 1 INHALATION DAILY 360 each 1  . Calcium Carbonate-Vitamin D 600-125 MG-UNIT TABS Take by mouth.    . cholecalciferol (VITAMIN D) 1000 UNITS tablet Take 5,000 Units by mouth every other day.     . simvastatin (ZOCOR) 20 MG tablet TAKE ONE TABLET BY MOUTH AT BEDTIME    . tiotropium (SPIRIVA HANDIHALER) 18 MCG inhalation capsule INHALE ONE CAPSULE BY MOUTH ONE TIME DAILY     No current facility-administered medications for this encounter.     Physical Findings:  height is 5\' 9"  (1.753 m) and weight is 186 lb 6.4 oz (84.6 kg). Her oral temperature is 98.4 F (36.9 C). Her blood pressure is 126/75 and her pulse is 103 (abnormal). Her respiration is 16 and oxygen saturation is 95%.   In general this is a well appearing caucasian female in no acute distress. She's alert and oriented x4 and appropriate throughout the examination. Cardiopulmonary assessment is negative for acute distress and she exhibits normal effort.   Lab Findings: Lab Results  Component Value Date   WBC 4.1 12/09/2009   WBC 3.5 (L) 09/04/2009   HGB 14.6 12/09/2009   HCT 42.2 12/09/2009   PLT 115 (L) 12/09/2009    Lab Results  Component Value Date   NA 142 06/23/2009   K 4.2 06/23/2009   CO2 29 06/23/2009   GLUCOSE 125 (H) 06/23/2009  BUN 10 06/23/2009   CREATININE 1.01 06/23/2009   BILITOT 1.2 06/23/2009   ALKPHOS 61 06/23/2009   AST 20 06/23/2009   ALT 15 06/23/2009   PROT 6.6 06/23/2009   ALBUMIN 4.4 06/23/2009   CALCIUM 9.5 06/23/2009    Radiographic Findings: Nm Pet Image Restag (ps) Skull Base To Thigh  Result Date: 05/10/2017 CLINICAL DATA:  Followup treatment strategy for Lung cancer. EXAM: NUCLEAR MEDICINE PET SKULL BASE TO THIGH TECHNIQUE: 9.0 mCi F-18 FDG was injected intravenously. Full-ring PET imaging was performed from the skull base to thigh after the radiotracer. CT data was obtained and used for attenuation correction and anatomic localization. FASTING BLOOD GLUCOSE:  Value:  124 mg/dl COMPARISON:  07/10/2015 FINDINGS: NECK No hypermetabolic lymph nodes in the neck. CHEST Normal heart size. Aortic atherosclerosis. Calcifications within the RCA, LAD coronary artery. No pericardial effusion. No hypermetabolic mediastinal or hilar lymph nodes. Advanced changes of centrilobular and paraseptal emphysema. There is a focal area of scarring and architectural distortion within the posterior aspect of the left upper lobe. The index lesion within this area measures 1.4 cm and has an SUV max equal to 1.48. On the previous exam this measured 1.5 cm and had an SUV max equal to 4.5. ABDOMEN/PELVIS No abnormal hypermetabolic activity within the liver, pancreas, adrenal glands, or spleen. Large stones within the gallbladder are identified. These measure up to 2.4 cm. There is enlargement of the lateral segment of left lobe of liver and caudate lobe of liver. Macro lobular contour the liver noted. Aortic atherosclerosis. No hypermetabolic lymph nodes in the abdomen or pelvis. SKELETON No focal hypermetabolic activity to suggest skeletal metastasis. IMPRESSION: 1. Interval response to therapy. Significantly diminished FDG uptake associated with index lesion in the posterior aspect of the left upper lobe. 2. Aortic Atherosclerosis (ICD10-I70.0) and Emphysema (ICD10-J43.9). Multi vessel coronary artery calcification noted. 3. Morphologic features of liver suggestive of cirrhosis. 4. Gallstones. Electronically Signed   By: Kerby Moors M.D.   On: 05/10/2017 08:56    Impression/Plan:   1. T1N0 non-small cell lung cancer of the left upper lung. Recent imaging with PET CT shows a good response to therapy and no new or progressive lesions.   We will plan to see her back with CT scan and follow up in 6 months.  She knows to call with any questions or concerns in the interim. 2. Liver Cirrhosis.  She continues with normal liver function labs at this point, per patient report.  She will continue disease  management and surveillance under the care and guidance of Dr. Juanita Craver.  Nicholos Johns, PA-C

## 2017-07-10 ENCOUNTER — Other Ambulatory Visit: Payer: Self-pay | Admitting: Gastroenterology

## 2017-07-10 NOTE — Progress Notes (Signed)
Toni Ralphs MD 

## 2017-07-12 ENCOUNTER — Encounter (HOSPITAL_COMMUNITY): Payer: Self-pay | Admitting: *Deleted

## 2017-07-13 ENCOUNTER — Encounter (HOSPITAL_COMMUNITY): Payer: Self-pay | Admitting: *Deleted

## 2017-07-25 ENCOUNTER — Encounter (HOSPITAL_COMMUNITY)
Admission: RE | Disposition: A | Discharge status: Home / Self Care | Payer: Self-pay | Source: Ambulatory Visit | Attending: Gastroenterology

## 2017-07-25 ENCOUNTER — Ambulatory Visit (HOSPITAL_COMMUNITY)
Admission: RE | Admit: 2017-07-25 | Discharge: 2017-07-25 | Disposition: A | Discharge status: Home / Self Care | Payer: Managed Care, Other (non HMO) | Source: Ambulatory Visit | Attending: Gastroenterology | Admitting: Gastroenterology

## 2017-07-25 ENCOUNTER — Ambulatory Visit (HOSPITAL_COMMUNITY): Payer: Managed Care, Other (non HMO) | Admitting: Certified Registered Nurse Anesthetist

## 2017-07-25 ENCOUNTER — Encounter (HOSPITAL_COMMUNITY): Payer: Self-pay | Admitting: *Deleted

## 2017-07-25 DIAGNOSIS — K7581 Nonalcoholic steatohepatitis (NASH): Secondary | ICD-10-CM | POA: Insufficient documentation

## 2017-07-25 DIAGNOSIS — Z87891 Personal history of nicotine dependence: Secondary | ICD-10-CM | POA: Insufficient documentation

## 2017-07-25 DIAGNOSIS — J449 Chronic obstructive pulmonary disease, unspecified: Secondary | ICD-10-CM | POA: Diagnosis not present

## 2017-07-25 DIAGNOSIS — E785 Hyperlipidemia, unspecified: Secondary | ICD-10-CM | POA: Diagnosis not present

## 2017-07-25 DIAGNOSIS — Z79899 Other long term (current) drug therapy: Secondary | ICD-10-CM | POA: Insufficient documentation

## 2017-07-25 DIAGNOSIS — D696 Thrombocytopenia, unspecified: Secondary | ICD-10-CM | POA: Diagnosis not present

## 2017-07-25 DIAGNOSIS — Z886 Allergy status to analgesic agent status: Secondary | ICD-10-CM | POA: Insufficient documentation

## 2017-07-25 DIAGNOSIS — K7460 Unspecified cirrhosis of liver: Secondary | ICD-10-CM | POA: Diagnosis not present

## 2017-07-25 DIAGNOSIS — K449 Diaphragmatic hernia without obstruction or gangrene: Secondary | ICD-10-CM | POA: Insufficient documentation

## 2017-07-25 DIAGNOSIS — E559 Vitamin D deficiency, unspecified: Secondary | ICD-10-CM | POA: Diagnosis not present

## 2017-07-25 DIAGNOSIS — I8510 Secondary esophageal varices without bleeding: Secondary | ICD-10-CM | POA: Diagnosis present

## 2017-07-25 DIAGNOSIS — Z85118 Personal history of other malignant neoplasm of bronchus and lung: Secondary | ICD-10-CM | POA: Diagnosis not present

## 2017-07-25 HISTORY — DX: Unspecified cirrhosis of liver: K74.60

## 2017-07-25 HISTORY — DX: Pneumonia, unspecified organism: J18.9

## 2017-07-25 HISTORY — PX: ESOPHAGOGASTRODUODENOSCOPY (EGD) WITH PROPOFOL: SHX5813

## 2017-07-25 HISTORY — DX: Malignant (primary) neoplasm, unspecified: C80.1

## 2017-07-25 HISTORY — DX: Dyspnea, unspecified: R06.00

## 2017-07-25 SURGERY — ESOPHAGOGASTRODUODENOSCOPY (EGD) WITH PROPOFOL
Anesthesia: Monitor Anesthesia Care

## 2017-07-25 MED ORDER — ONDANSETRON HCL 4 MG/2ML IJ SOLN
INTRAMUSCULAR | Status: DC | PRN
  Administered 2017-07-25: 4 mg via INTRAVENOUS

## 2017-07-25 MED ORDER — LIDOCAINE 2% (20 MG/ML) 5 ML SYRINGE
INTRAMUSCULAR | Status: AC
  Filled 2017-07-25: qty 5

## 2017-07-25 MED ORDER — PROPOFOL 10 MG/ML IV BOLUS
INTRAVENOUS | Status: AC
  Filled 2017-07-25: qty 40

## 2017-07-25 MED ORDER — LACTATED RINGERS IV SOLN
INTRAVENOUS | Status: DC
  Administered 2017-07-25: 1000 mL via INTRAVENOUS

## 2017-07-25 MED ORDER — PROPOFOL 10 MG/ML IV BOLUS
INTRAVENOUS | Status: DC | PRN
  Administered 2017-07-25: 20 mg via INTRAVENOUS

## 2017-07-25 MED ORDER — SODIUM CHLORIDE 0.9 % IV SOLN: INTRAVENOUS | Status: DC

## 2017-07-25 MED ORDER — PROPOFOL 500 MG/50ML IV EMUL
INTRAVENOUS | Status: DC | PRN
  Administered 2017-07-25: 150 ug/kg/min via INTRAVENOUS

## 2017-07-25 MED ORDER — LIDOCAINE 2% (20 MG/ML) 5 ML SYRINGE
INTRAMUSCULAR | Status: DC | PRN
  Administered 2017-07-25: 100 mg via INTRAVENOUS

## 2017-07-25 MED ORDER — ONDANSETRON HCL 4 MG/2ML IJ SOLN
INTRAMUSCULAR | Status: AC
  Filled 2017-07-25: qty 2

## 2017-07-25 SURGICAL SUPPLY — 14 items

## 2017-07-25 NOTE — Anesthesia Procedure Notes (Signed)
Procedure Name: MAC Date/Time: 07/25/2017 7:29 AM Performed by: West Pugh Pre-anesthesia Checklist: Patient identified, Emergency Drugs available, Suction available, Patient being monitored and Timeout performed Patient Re-evaluated:Patient Re-evaluated prior to induction Oxygen Delivery Method: Nasal cannula Placement Confirmation: positive ETCO2 and CO2 detector Dental Injury: Teeth and Oropharynx as per pre-operative assessment

## 2017-07-25 NOTE — Op Note (Signed)
[facility] Patient Name: Toni Moore Procedure Date: 07/25/2017 MRN: [id number] Attending MD: Juanita Craver , MD Date of Birth: May 08, 1956 CSN: [id number] Age: 61 Admit Type: Outpatient Procedure:                Diagnostic EGD. Indications:              Screening procedure for esophageal varices;                            cirrhosis noted on CT-NASH. Providers:                Juanita Craver, MD, Elmer Ramp. Tilden Dome, RN, Tinnie Gens,                            Technician, Christell Faith, CRNA Referring MD:             Vicenta Aly, FNP Medicines:                Monitored Anesthesia Care Complications:            No immediate complications. Estimated Blood Loss:     Estimated blood loss: none. Procedure:                Pre-anesthesia assessment: - Prior to the                            procedure, a history and physical was performed,                            and patient medications and allergies were                            reviewed. The patient's tolerance of previous                            anesthesia was also reviewed. The risks and                            benefits of the procedure and the sedation options                            and risks were discussed with the patient. All                            questions were answered, and informed consent was                            obtained. Prior anticoagulants: The patient has                            taken no previous anticoagulant or antiplatelet                            agents. ASA Grade Assessment: IV - A patient with  severe systemic disease that is a constant threat                            to life. After reviewing the risks and benefits,                            the patient was deemed in satisfactory condition to                            undergo the procedure. After obtaining informed                            consent, the endoscope was passed under direct                      vision. Throughout the procedure, the patient's                            blood pressure, pulse, and oxygen saturations were                            monitored continuously. The [id number]                            scope was introduced through the mouth, and                            advanced to the second part of duodenum. The EGD                            was accomplished without difficulty. The patient                            tolerated the procedure well. Scope In: Scope Out: Findings:      The examined esophagus and GEJ appeared widely patent and normal.      A small hiatal hernia was noted on retroflexion; the rest of the stomach       appeared normal.      The examined duodenum was normal. Impression:               - Normal appearing, widely patent esophagus and                            GEJ; no varices noted.                           - Small hiatal hernia; otherwise normal appearing                            stomach; no evidence of varices.                           - Normal examined duodenum; no varices noted.                           -  No specimens collected. Moderate Sedation:      MAC used. Procedure time 18 minutes. Recommendation:           - High fiber diet with augmented water consumption                            daily.                           - Continue present medications.                           - Return to my office in 6 months. Procedure Code(s):        --- Professional ---                           [phone], GC, Esophagogastroduodenoscopy, flexible,                            transoral; diagnostic, including collection of                            specimen(s) by brushing or washing, when performed                            (separate procedure) Diagnosis Code(s):        --- Professional ---                           Z13.810, Encounter for screening for upper                            gastrointestinal disorder                            K44.9, Diaphragmatic hernia without obstruction or                            gangrene                           W38.93, Nonalcoholic steatohepatitis (NASH)                           R93.3, Abnormal findings on diagnostic imaging of                            other parts of digestive tract CPT copyright 2016 American Medical Association. All rights reserved. The codes documented in this report are preliminary and upon coder review may  be revised to meet current compliance requirements. Juanita Craver, MD Juanita Craver, MD 07/25/2017 7:54:25 AM This report has been signed electronically. Number of Addenda: 0

## 2017-07-25 NOTE — Discharge Instructions (Signed)

## 2017-07-25 NOTE — Anesthesia Postprocedure Evaluation (Signed)
Anesthesia Post Note  Patient: Toni Moore  Procedure(s) Performed: Procedure(s) (LRB): ESOPHAGOGASTRODUODENOSCOPY (EGD) WITH PROPOFOL (N/A)     Patient location during evaluation: PACU Anesthesia Type: MAC Level of consciousness: awake Pain management: pain level controlled Vital Signs Assessment: post-procedure vital signs reviewed and stable Respiratory status: spontaneous breathing Cardiovascular status: stable Postop Assessment: no signs of nausea or vomiting Anesthetic complications: no    Last Vitals:  Vitals:   07/25/17 0639 07/25/17 0745  BP: (!) 159/80 118/72  Pulse: 95 91  Resp: 17 (!) 21  Temp: 36.6 C 36.6 C  SpO2: 95% 97%    Last Pain:  Vitals:   07/25/17 0745  TempSrc: Oral   Pain Goal:                 Qaadir Kent JR,JOHN Irma Roulhac

## 2017-07-25 NOTE — H&P (Addendum)
Toni Moore is an 61 y.o. female.   Chief Complaint: Screening for varices; recent diagnosis of cirrhosis HPI: 61 year old white female here for an EGD to screenin for varices. See office notes for details.  Past Medical History:  Diagnosis Date  . Cancer ([place])    lung ca finished radaition nov 2016  . Chronic respiratory failure ([place])   . Cirrhosis of liver ([place])    new dx 2018  . COPD (chronic obstructive pulmonary disease) ([place])   . Dyspnea    with activity, uses oxygen 2 liters at hx  . Hoarseness   . Hyperlipidemia   . Pneumonia    last time few yrs ago  . Thrombocytopenia ([place])   . Unspecified chronic bronchitis ([place])   . Vitamin D deficiency   . Vocal cord cyst    Past Surgical History:  Procedure Laterality Date  . HAMMER TOE SURGERY     x3  . TUBAL LIGATION  1985  . WISDOM TOOTH EXTRACTION     Family History  Problem Relation Age of Onset  . Diabetes Unknown   . Heart disease Father   . Breast cancer Sister   . Healthy Brother   . Ulcerative colitis Brother   . Healthy Brother   . Healthy Daughter   . Other Daughter        blood dyscrasia as a young child  . Healthy Daughter   . Healthy Son    Social History:  reports that she quit smoking about 2 years ago. Her smoking use included Cigarettes. She has a 5.88 pack-year smoking history. She has never used smokeless tobacco. She reports that she drinks alcohol. She reports that she does not use drugs.  Allergies:  Allergies  Allergen Reactions  . Asa [Aspirin] Other (See Comments)    Other reaction(s): Bleeding (intolerance) Bleeds non-stop Bleeds non-stop   Medications Prior to Admission  Medication Sig Dispense Refill  . albuterol (VENTOLIN HFA) 108 (90 BASE) MCG/ACT inhaler Inhale 2 puffs into the lungs every 6 (six) hours as needed (for wheezing/shortness of breath).     . betamethasone dipropionate (DIPROLENE) 0.05 % cream Apply 1 application topically 2 (two) times daily as needed. For itchy  skin    . BREO ELLIPTA 100-25 MCG/INH AEPB USE 1 INHALATION DAILY 360 each 1  . Calcium Carbonate-Vitamin D3 (CALCIUM 600+D3) 600-400 MG-UNIT TABS Take 1 tablet by mouth 2 (two) times daily.    . Cholecalciferol (VITAMIN D-3) 5000 units TABS Take 5,000 Units by mouth every 3 (three) days.    . hydrOXYzine (ATARAX/VISTARIL) 25 MG tablet Take 25 mg by mouth daily.    . magnesium oxide (MAG-OX) 400 MG tablet Take 400 mg by mouth at bedtime.    . OXYGEN Inhale 2 L into the lungs at bedtime.    Marland Kitchen PREVIDENT 5000 DRY MOUTH 1.1 % GEL dental gel Place 1 application onto teeth daily.  5  . simvastatin (ZOCOR) 20 MG tablet Take 20 mg by mouth at bedtime.     Marland Kitchen tiotropium (SPIRIVA HANDIHALER) 18 MCG inhalation capsule INHALE ONE CAPSULE BY MOUTH ONE TIME DAILY    . zinc gluconate 50 MG tablet Take 50 mg by mouth 2 (two) times a week. Monday & Friday nights.      No results found for this or any previous visit (from the past 48 hour(s)). No results found.  Review of Systems  Constitutional: Negative.   HENT: Negative.   Eyes: Negative.   Respiratory: Positive  for shortness of breath.   Cardiovascular: Negative.   Gastrointestinal: Negative.   Genitourinary: Negative.   Musculoskeletal: Negative.   Skin: Negative.   Neurological: Negative.   Endo/Heme/Allergies: Negative.   Psychiatric/Behavioral: Negative.    Blood pressure (!) 159/80, pulse 95, temperature 97.9 F (36.6 C), temperature source Oral, resp. rate 17, height 5\' 9"  (1.753 m), weight 84.4 kg (186 lb), SpO2 95 %. Physical Exam  Constitutional: She is oriented to person, place, and time. She appears well-developed and well-nourished.  HENT:  Head: Normocephalic and atraumatic.  Eyes: Pupils are equal, round, and reactive to light. Conjunctivae and EOM are normal.  Neck: Normal range of motion. Neck supple.  Cardiovascular: Normal rate and regular rhythm.   Respiratory: Effort normal and breath sounds normal.  GI: Soft. Bowel  sounds are normal.  Musculoskeletal: Normal range of motion.  Neurological: She is alert and oriented to person, place, and time.  Skin: Skin is warm and dry.  Psychiatric: She has a normal mood and affect. Her behavior is normal. Judgment and thought content normal.    Assessment/Plan Cirrhosis on CT scan_proceed with an EGD to rule out varices.   Encarnacion Scioneaux, MD 07/25/2017, 7:21 AM

## 2017-07-25 NOTE — Transfer of Care (Signed)
Immediate Anesthesia Transfer of Care Note  Patient: Toni Moore  Procedure(s) Performed: Procedure(s): ESOPHAGOGASTRODUODENOSCOPY (EGD) WITH PROPOFOL (N/A)  Patient Location: PACU  Anesthesia Type:MAC  Level of Consciousness:  sedated, patient cooperative and responds to stimulation  Airway & Oxygen Therapy:Patient Spontanous Breathing and Patient connected to face mask oxgen  Post-op Assessment:  Report given to PACU RN and Post -op Vital signs reviewed and stable  Post vital signs:  Reviewed and stable  Last Vitals:  Vitals:   07/25/17 0639  BP: (!) 159/80  Pulse: 95  Resp: 17  Temp: 36.6 C  SpO2: 81%    Complications: No apparent anesthesia complications

## 2017-07-25 NOTE — Anesthesia Preprocedure Evaluation (Addendum)
Anesthesia Evaluation  Patient identified by MRN, date of birth, ID band Patient awake    Reviewed: Allergy & Precautions, NPO status , Patient's Chart, lab work & pertinent test results  Airway Mallampati: I       Dental  (+) Teeth Intact   Pulmonary former smoker,    Pulmonary exam normal breath sounds clear to auscultation       Cardiovascular negative cardio ROS Normal cardiovascular exam Rhythm:Regular Rate:Normal     Neuro/Psych negative psych ROS   GI/Hepatic negative GI ROS, (+) Cirrhosis       ,   Endo/Other  negative endocrine ROS  Renal/GU negative Renal ROS  negative genitourinary   Musculoskeletal negative musculoskeletal ROS (+)   Abdominal Normal abdominal exam  (+)   Peds  Hematology negative hematology ROS (+)   Anesthesia Other Findings   Reproductive/Obstetrics                            Anesthesia Physical Anesthesia Plan  ASA: II  Anesthesia Plan: MAC   Post-op Pain Management:    Induction: Intravenous  PONV Risk Score and Plan: 2 and Ondansetron, Dexamethasone and Treatment may vary due to age or medical condition  Airway Management Planned: Natural Airway and Simple Face Mask  Additional Equipment:   Intra-op Plan:   Post-operative Plan:   Informed Consent: I have reviewed the patients History and Physical, chart, labs and discussed the procedure including the risks, benefits and alternatives for the proposed anesthesia with the patient or authorized representative who has indicated his/her understanding and acceptance.     Plan Discussed with: CRNA and Surgeon  Anesthesia Plan Comments:         Anesthesia Quick Evaluation

## 2017-07-26 ENCOUNTER — Encounter (HOSPITAL_COMMUNITY): Payer: Self-pay | Admitting: Gastroenterology

## 2017-09-19 ENCOUNTER — Ambulatory Visit: Payer: Managed Care, Other (non HMO) | Admitting: Pulmonary Disease

## 2017-10-10 ENCOUNTER — Ambulatory Visit: Payer: Managed Care, Other (non HMO) | Admitting: Pulmonary Disease

## 2017-10-10 ENCOUNTER — Encounter: Payer: Self-pay | Admitting: Pulmonary Disease

## 2017-10-10 DIAGNOSIS — R05 Cough: Secondary | ICD-10-CM

## 2017-10-10 DIAGNOSIS — R911 Solitary pulmonary nodule: Secondary | ICD-10-CM | POA: Diagnosis not present

## 2017-10-10 DIAGNOSIS — R059 Cough, unspecified: Secondary | ICD-10-CM

## 2017-10-10 DIAGNOSIS — J9610 Chronic respiratory failure, unspecified whether with hypoxia or hypercapnia: Secondary | ICD-10-CM | POA: Diagnosis not present

## 2017-10-10 DIAGNOSIS — J440 Chronic obstructive pulmonary disease with acute lower respiratory infection: Secondary | ICD-10-CM

## 2017-10-10 MED ORDER — AZITHROMYCIN 250 MG PO TABS: ORAL_TABLET | ORAL | 0 refills | Status: DC

## 2017-10-10 MED ORDER — FLUTICASONE-UMECLIDIN-VILANT 100-62.5-25 MCG/INH IN AEPB: 1.0000 | INHALATION_SPRAY | Freq: Every day | RESPIRATORY_TRACT | 0 refills | Status: DC

## 2017-10-10 MED ORDER — PREDNISONE 10 MG PO TABS: ORAL_TABLET | ORAL | 0 refills | Status: DC

## 2017-10-10 NOTE — Patient Instructions (Signed)
Schedule CT chest without contrast in mid December. Prescription for Z-Pak. Prednisone 10 mg tabs  Take 2 tabs daily with food x 5ds, then 1 tab daily with food x 5ds then STOP  Trial of TRELEGY instead of Brio and Spiriva -call us for prescription if this works

## 2017-10-10 NOTE — Assessment & Plan Note (Signed)
Ct 2 L O2 bedtime

## 2017-10-10 NOTE — Addendum Note (Signed)
Addended by: Valerie Salts on: 10/10/2017 10:40 AM   Modules accepted: Orders
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
## 2017-10-10 NOTE — Assessment & Plan Note (Signed)
Schedule CT chest without contrast in mid December. 95m FU

## 2017-10-10 NOTE — Assessment & Plan Note (Signed)
Treat as acute bronchitis + flare Prescription for Z-Pak. Prednisone 10 mg tabs  Take 2 tabs daily with food x 5ds, then 1 tab daily with food x 5ds then STOP  Trial of TRELEGY instead of Brio and Spiriva -call us for prescription if this works

## 2017-10-17 ENCOUNTER — Ambulatory Visit (INDEPENDENT_AMBULATORY_CARE_PROVIDER_SITE_OTHER)
Admission: RE | Admit: 2017-10-17 | Discharge: 2017-10-17 | Disposition: A | Discharge status: Home / Self Care | Payer: Managed Care, Other (non HMO) | Source: Ambulatory Visit | Attending: Pulmonary Disease | Admitting: Pulmonary Disease

## 2017-10-17 DIAGNOSIS — R059 Cough, unspecified: Secondary | ICD-10-CM

## 2017-10-17 DIAGNOSIS — R05 Cough: Secondary | ICD-10-CM

## 2017-10-23 ENCOUNTER — Telehealth: Payer: Self-pay | Admitting: Pulmonary Disease

## 2017-10-23 DIAGNOSIS — R911 Solitary pulmonary nodule: Secondary | ICD-10-CM

## 2017-10-23 MED ORDER — FLUTICASONE-UMECLIDIN-VILANT 100-62.5-25 MCG/INH IN AEPB: 1.0000 | INHALATION_SPRAY | Freq: Every day | RESPIRATORY_TRACT | 3 refills | Status: DC

## 2017-10-23 NOTE — Telephone Encounter (Signed)
Prednisone 20mg  daily x 5 days  Augmentin 875 bid x 5 days Call if no improvement

## 2017-10-23 NOTE — Telephone Encounter (Signed)
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  CT has been ordered.   Pt also states she completed course of zpak that was prescribed on 10/10/17 with no improvement.  Pt reports of cont prod cough with light green mucus, increased sob, wheezing and chest discomfort. Pt also states Trelegy is effective and [facility]  Ra has been sent to preferred pharmacy.   BQ please advise on symptoms, as RA is unavailable.

## 2017-10-23 NOTE — Telephone Encounter (Signed)
ATC pt, no answer. Left message for pt to call back.  Medications pended until pharmacy is verified.

## 2017-10-25 NOTE — Telephone Encounter (Signed)
lmtcb x2 for pt. 

## 2017-10-26 MED ORDER — AMOXICILLIN-POT CLAVULANATE 875-125 MG PO TABS: 1.0000 | ORAL_TABLET | Freq: Two times a day (BID) | ORAL | 0 refills | Status: DC

## 2017-10-26 MED ORDER — PREDNISONE 20 MG PO TABS: 20.0000 mg | ORAL_TABLET | Freq: Every day | ORAL | 0 refills | Status: DC

## 2017-10-26 NOTE — Telephone Encounter (Signed)
LMTCB x3. When ahead and sent meds to the last pharmacy used for short term medications which was [facility] on [address].

## 2018-02-09 ENCOUNTER — Ambulatory Visit: Payer: Managed Care, Other (non HMO) | Admitting: Adult Health

## 2018-02-13 ENCOUNTER — Encounter: Payer: Self-pay | Admitting: Adult Health

## 2018-02-13 ENCOUNTER — Ambulatory Visit: Payer: Managed Care, Other (non HMO) | Admitting: Adult Health

## 2018-02-13 DIAGNOSIS — J449 Chronic obstructive pulmonary disease, unspecified: Secondary | ICD-10-CM

## 2018-02-13 DIAGNOSIS — R911 Solitary pulmonary nodule: Secondary | ICD-10-CM | POA: Diagnosis not present

## 2018-02-13 DIAGNOSIS — J9611 Chronic respiratory failure with hypoxia: Secondary | ICD-10-CM | POA: Diagnosis not present

## 2018-02-13 MED ORDER — PNEUMOCOCCAL 13-VAL CONJ VACC IM SUSP: 0.5000 mL | Freq: Once | INTRAMUSCULAR | Status: DC

## 2018-02-13 NOTE — Progress Notes (Signed)
@Patient  ID: Toni Moore, female    DOB: June 30, 1956, 62 y.o.   MRN: [id number]  Chief Complaint  Patient presents with  . Follow-up    COPD     Referring provider: Vicenta Aly, FNP  HPI: 62 yo female former smoker followed for GOLD IV  COPD , Presumed Lung cancer with hypermetabolic LUL nodule s/p SBRT 08/2015 , O2 RF w/ nocturnal O2 , New lung nodule RLL 09/2017  TEST  Spirometry 05/2014 showed FEV1 of 0.63-20%, FVC 51% ,ratio of 30.  CT chest 2/2018Slightincrease in size of posterior left upper lobe pulmonary nodule. -by 37mm compared to 12/2015 PET 04/2017 >> decreased SUV from 4.5 to 1.5  10/2014 ONO >>mild desaturation 1.5 h during sleep. Completed pulm rehab 12/2014 2L oxygen During sleep  ONO >>shows sawtooth pattern PSG 08/2016 neg for OSA  PET scan from June 2018 which showed decrease in hypermetabolic from consistent with resolving malignancy.  CT chest November 2018 showed posterior left upper lobe nodule with probable radiation fibrosis, emphysema, scattered pulmonary nodules to right lower lobe nodules are new   02/13/2018 Follow up ; COPD , Hypermetabolic lung nodule s/p SBRT , O2 RF .  Patient returns for a 64-month follow-up.  Patient has very severe COPD. She remains on TRELEGY daily .  Does occasionally use Spiriva as well.  We discussed to not take double LAMA medication .  Patient says overall her breathing is doing okay . Working Biochemist, clinical .  Denies any flare of cough or shortness of breath. Does get sob walking long distance or going up stairs.  Rare SABA use .   Patient remains on nocturnal oxygen.  Feels that it helps her  Patient has known history of presumed lung cancer with a hypermetabolic left upper lobe nodule and 2016 status post SBRT .  Serial CT scan follow-up has shown stable area.  PET scan June 2018 showed a decrease in hypermetabolic activity.  CT chest November 2018 showed stable left upper lobe nodule with probable  radiation fibrosis.  There was new right lower lobe nodules.  She has a planned CT scan in May 2019. She denies any hemoptysis or weight loss.   PVX is utd. Discussed Prevnar vaccine today . Wants to check with PCP about Prevnar .    Allergies  Allergen Reactions  . Asa [Aspirin] Other (See Comments)    Other reaction(s): Bleeding (intolerance) Bleeds non-stop Bleeds non-stop    Immunization History  Administered Date(s) Administered  . Influenza Split 09/29/2007, 06/28/2009, 09/28/2014  . Influenza, Quadrivalent, Recombinant, Inj, Pf 10/16/2016  . Influenza, Seasonal, Injecte, Preservative Fre 10/27/2014, 10/15/2015  . Influenza,inj,Quad PF,6+ Mos 10/27/2014  . Influenza,trivalent, recombinat, inj, PF 08/13/2012, 08/15/2013  . Influenza-Unspecified 10/15/2015  . Pneumococcal Polysaccharide-23 11/28/2005, 11/15/2016  . Tdap 01/14/2010    Past Medical History:  Diagnosis Date  . Cancer ([place])    lung ca finished radaition nov 2016  . Chronic respiratory failure ([place])   . Cirrhosis of liver ([place])    new dx 2018  . COPD (chronic obstructive pulmonary disease) ([place])   . Dyspnea    with activity, uses oxygen 2 liters at hx  . Hoarseness   . Hyperlipidemia   . Pneumonia    last time few yrs ago  . Thrombocytopenia ([place])   . Unspecified chronic bronchitis ([place])   . Vitamin D deficiency   . Vocal cord cyst     Tobacco History: Social History   Tobacco Use  Smoking Status Former Smoker  .  Packs/day: 0.14  . Years: 42.00  . Pack years: 5.88  . Types: Cigarettes  . Last attempt to quit: 07/29/2014  . Years since quitting: 3.5  Smokeless Tobacco Never Used  Tobacco Comment   currently using Nictroll as needed   Counseling given: Not Answered Comment: currently using Nictroll as needed   Outpatient Encounter Medications as of 02/13/2018  Medication Sig  . albuterol (VENTOLIN HFA) 108 (90 BASE) MCG/ACT inhaler Inhale 2 puffs into the lungs every 6 (six) hours as  needed (for wheezing/shortness of breath).   . betamethasone dipropionate (DIPROLENE) 0.05 % cream Apply 1 application topically 2 (two) times daily as needed. For itchy skin  . Calcium Carbonate-Vitamin D3 (CALCIUM 600+D3) 600-400 MG-UNIT TABS Take 1 tablet by mouth 2 (two) times daily.  . Cholecalciferol (VITAMIN D-3) 5000 units TABS Take 5,000 Units by mouth every 3 (three) days.  . Fluticasone-Umeclidin-Vilant (TRELEGY ELLIPTA) 100-62.5-25 MCG/INH AEPB Inhale 1 puff into the lungs daily.  . hydrOXYzine (ATARAX/VISTARIL) 25 MG tablet Take 25 mg by mouth daily.  . magnesium oxide (MAG-OX) 400 MG tablet Take 400 mg by mouth at bedtime.  . OXYGEN Inhale 2 L into the lungs at bedtime.  Marland Kitchen PREVIDENT 5000 DRY MOUTH 1.1 % GEL dental gel Place 1 application onto teeth daily.  . simvastatin (ZOCOR) 20 MG tablet Take 20 mg by mouth at bedtime.   Marland Kitchen zinc gluconate 50 MG tablet Take 50 mg by mouth 2 (two) times a week. Monday & Friday nights.  . [DISCONTINUED] tiotropium (SPIRIVA HANDIHALER) 18 MCG inhalation capsule INHALE ONE CAPSULE BY MOUTH ONE TIME DAILY  . [DISCONTINUED] amoxicillin-clavulanate (AUGMENTIN) 875-125 MG tablet Take 1 tablet by mouth 2 (two) times daily.  . [DISCONTINUED] azithromycin (ZITHROMAX) 250 MG tablet Take 2 tablets on the first day, then 1 tablet daily unfinished  . [DISCONTINUED] BREO ELLIPTA 100-25 MCG/INH AEPB USE 1 INHALATION DAILY  . [DISCONTINUED] predniSONE (DELTASONE) 10 MG tablet Take 2 tablets daily with food for 5 days, then 1 tablet daily for 5 days and stop.  . [DISCONTINUED] predniSONE (DELTASONE) 20 MG tablet Take 1 tablet (20 mg total) by mouth daily with breakfast.  . [DISCONTINUED] pneumococcal 13-valent conjugate vaccine (PREVNAR 13) injection 0.5 mL    No facility-administered encounter medications on file as of 02/13/2018.      Review of Systems  Constitutional:   No  weight loss, night sweats,  Fevers, chills, fatigue, or  lassitude.  HEENT:   No  headaches,  Difficulty swallowing,  Tooth/dental problems, or  Sore throat,                No sneezing, itching, ear ache, nasal congestion, post nasal drip,   CV:  No chest pain,  Orthopnea, PND, swelling in lower extremities, anasarca, dizziness, palpitations, syncope.   GI  No heartburn, indigestion, abdominal pain, nausea, vomiting, diarrhea, change in bowel habits, loss of appetite, bloody stools.   Resp: No shortness of breath with exertion or at rest.  No excess mucus, no productive cough,  No non-productive cough,  No coughing up of blood.  No change in color of mucus.  No wheezing.  No chest wall deformity  Skin: no rash or lesions.  GU: no dysuria, change in color of urine, no urgency or frequency.  No flank pain, no hematuria   MS:  No joint pain or swelling.  No decreased range of motion.  No back pain.    Physical Exam  BP 138/84 (BP Location: Left Arm,  Cuff Size: Normal)   Pulse 74   Ht 5\' 9"  (1.753 m)   Wt 190 lb (86.2 kg)   SpO2 92%   BMI 28.06 kg/m   GEN: A/Ox3; pleasant , NAD, well nourished    HEENT:  [place]/AT,  EACs-clear, TMs-wnl, NOSE-clear, THROAT-clear, no lesions, no postnasal drip or exudate noted.   NECK:  Supple w/ fair ROM; no JVD; normal carotid impulses w/o bruits; no thyromegaly or nodules palpated; no lymphadenopathy.    RESP  Clear  P & A; w/o, wheezes/ rales/ or rhonchi. no accessory muscle use, no dullness to percussion  CARD:  RRR, no m/r/g, no peripheral edema, pulses intact, no cyanosis or clubbing.  GI:   Soft & nt; nml bowel sounds; no organomegaly or masses detected.   Musco: Warm bil, no deformities or joint swelling noted.   Neuro: alert, no focal deficits noted.    Skin: Warm, no lesions or rashes    Lab Results:  CBC    Component Value Date/Time   WBC 4.1 12/09/2009 1046   WBC 3.5 (L) 09/04/2009 0705   RBC 4.15 12/09/2009 1046   RBC 3.95 09/04/2009 0705   HGB 14.6 12/09/2009 1046   HCT 42.2 12/09/2009 1046   PLT 115  (L) 12/09/2009 1046   MCV 101.6 (H) 12/09/2009 1046   MCH 35.1 (H) 12/09/2009 1046   MCHC 34.6 12/09/2009 1046   MCHC 34.0 09/04/2009 0705   RDW 13.5 12/09/2009 1046   LYMPHSABS 1.5 12/09/2009 1046   MONOABS 0.3 12/09/2009 1046   EOSABS 0.1 12/09/2009 1046   BASOSABS 0.0 12/09/2009 1046    BMET    Component Value Date/Time   NA 142 06/23/2009 1348   K 4.2 06/23/2009 1348   CL 103 06/23/2009 1348   CO2 29 06/23/2009 1348   GLUCOSE 125 (H) 06/23/2009 1348   BUN 10 06/23/2009 1348   CREATININE 1.01 06/23/2009 1348   CALCIUM 9.5 06/23/2009 1348    BNP No results found for: BNP  ProBNP No results found for: PROBNP  Imaging: No results found.   Assessment & Plan:   Chronic obstructive pulmonary disease ([place]) Gold IV COPD currently compensated without exacerbation  Plan  Patient Instructions  Continue on TRELEGY 1 puff daily , rinse after use.  Continue on Oxygen 2l/m At bedtime   Follow up for CT chest as planned in May 2018  Follow up Dr. Elsworth Soho  In 4 months and As needed        Chronic respiratory failure ([place]) Continue on oxygen at bedtime  Solitary pulmonary nodule Hypermetabolic left upper lobe nodule with presumed lung cancer status post SBRT  Serial follow-up has showed stable area there was 2 new right lower lobe nodules noted.  That will need continued follow-up.  CT chest is planned for May 2019  PLAN  Patient Instructions  Continue on TRELEGY 1 puff daily , rinse after use.  Continue on Oxygen 2l/m At bedtime   Follow up for CT chest as planned in May 2018  Follow up Dr. Elsworth Soho  In 4 months and As needed          Rexene Edison, NP 02/13/2018

## 2018-02-13 NOTE — Assessment & Plan Note (Signed)
Hypermetabolic left upper lobe nodule with presumed lung cancer status post SBRT  Serial follow-up has showed stable area there was 2 new right lower lobe nodules noted.  That will need continued follow-up.  CT chest is planned for May 2019  PLAN  Patient Instructions  Continue on TRELEGY 1 puff daily , rinse after use.  Continue on Oxygen 2l/m At bedtime   Follow up for CT chest as planned in May 2018  Follow up Dr. Elsworth Soho  In 4 months and As needed
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
## 2018-02-13 NOTE — Assessment & Plan Note (Signed)
Continue on oxygen at bedtime

## 2018-02-13 NOTE — Patient Instructions (Signed)
Continue on TRELEGY 1 puff daily , rinse after use.  Continue on Oxygen 2l/m At bedtime   Follow up for CT chest as planned in May 2018  Follow up Dr. Elsworth Soho  In 4 months and As needed

## 2018-02-13 NOTE — Assessment & Plan Note (Signed)
Gold IV COPD currently compensated without exacerbation  Plan  Patient Instructions  Continue on TRELEGY 1 puff daily , rinse after use.  Continue on Oxygen 2l/m At bedtime   Follow up for CT chest as planned in May 2018  Follow up Dr. Elsworth Soho  In 4 months and As needed

## 2018-04-24 ENCOUNTER — Other Ambulatory Visit: Payer: Self-pay | Admitting: Gastroenterology

## 2018-04-24 ENCOUNTER — Ambulatory Visit (INDEPENDENT_AMBULATORY_CARE_PROVIDER_SITE_OTHER)
Admission: RE | Admit: 2018-04-24 | Discharge: 2018-04-24 | Disposition: A | Discharge status: Home / Self Care | Payer: Managed Care, Other (non HMO) | Source: Ambulatory Visit | Attending: Pulmonary Disease | Admitting: Pulmonary Disease

## 2018-04-24 DIAGNOSIS — R911 Solitary pulmonary nodule: Secondary | ICD-10-CM

## 2018-04-27 NOTE — Progress Notes (Signed)
Lmom5/31

## 2018-05-03 ENCOUNTER — Ambulatory Visit
Admission: RE | Admit: 2018-05-03 | Discharge: 2018-05-03 | Disposition: A | Discharge status: Home / Self Care | Payer: Managed Care, Other (non HMO) | Source: Ambulatory Visit | Attending: Urology | Admitting: Urology

## 2018-05-03 ENCOUNTER — Encounter: Payer: Self-pay | Admitting: *Deleted

## 2018-05-03 DIAGNOSIS — C3412 Malignant neoplasm of upper lobe, left bronchus or lung: Secondary | ICD-10-CM

## 2018-05-03 NOTE — Progress Notes (Signed)
Toni Moore 62 y.o. woman with T1N0 non-small cell lung cancer of the left upper lung completed radiation 04-24-18, FU.  Weight changes, if any: Wt Readings from Last 3 Encounters:  05/15/18 189 lb 3.2 oz (85.8 kg)  05/08/18 190 lb (86.2 kg)  02/13/18 190 lb (86.2 kg)   Respiratory complaints, if any: SOB with exertionon O 2 2 L/M N/C at night, Swallowing Problems/Pain/Difficulty swallowing:No Appetite :Good Pain:No BP 131/70 (BP Location: Right Arm, Patient Position: Sitting, Cuff Size: Normal)   Pulse 99   Temp 98 F (36.7 C) (Oral)   Resp 20   Ht 5\' 9"  (1.753 m)   Wt 189 lb 3.2 oz (85.8 kg)   SpO2 100%   BMI 27.94 kg/m

## 2018-05-03 NOTE — Progress Notes (Signed)
1000 Returned call PQ:[id number] up appointment re-scheuled for 05-15-18 at 1430.

## 2018-05-07 ENCOUNTER — Encounter (HOSPITAL_COMMUNITY): Payer: Self-pay | Admitting: *Deleted

## 2018-05-08 ENCOUNTER — Ambulatory Visit (HOSPITAL_COMMUNITY): Payer: Managed Care, Other (non HMO) | Admitting: Certified Registered"

## 2018-05-08 ENCOUNTER — Ambulatory Visit (HOSPITAL_COMMUNITY)
Admission: RE | Admit: 2018-05-08 | Discharge: 2018-05-08 | Disposition: A | Discharge status: Home / Self Care | Payer: Managed Care, Other (non HMO) | Source: Ambulatory Visit | Attending: Gastroenterology | Admitting: Gastroenterology

## 2018-05-08 ENCOUNTER — Other Ambulatory Visit: Payer: Self-pay

## 2018-05-08 ENCOUNTER — Encounter (HOSPITAL_COMMUNITY): Payer: Self-pay | Admitting: *Deleted

## 2018-05-08 ENCOUNTER — Encounter (HOSPITAL_COMMUNITY)
Admission: RE | Disposition: A | Discharge status: Home / Self Care | Payer: Self-pay | Source: Ambulatory Visit | Attending: Gastroenterology

## 2018-05-08 DIAGNOSIS — Z79899 Other long term (current) drug therapy: Secondary | ICD-10-CM | POA: Insufficient documentation

## 2018-05-08 DIAGNOSIS — E559 Vitamin D deficiency, unspecified: Secondary | ICD-10-CM | POA: Diagnosis not present

## 2018-05-08 DIAGNOSIS — D123 Benign neoplasm of transverse colon: Secondary | ICD-10-CM | POA: Diagnosis not present

## 2018-05-08 DIAGNOSIS — E785 Hyperlipidemia, unspecified: Secondary | ICD-10-CM | POA: Diagnosis not present

## 2018-05-08 DIAGNOSIS — Z803 Family history of malignant neoplasm of breast: Secondary | ICD-10-CM | POA: Diagnosis not present

## 2018-05-08 DIAGNOSIS — Z923 Personal history of irradiation: Secondary | ICD-10-CM | POA: Insufficient documentation

## 2018-05-08 DIAGNOSIS — D696 Thrombocytopenia, unspecified: Secondary | ICD-10-CM | POA: Diagnosis not present

## 2018-05-08 DIAGNOSIS — D128 Benign neoplasm of rectum: Secondary | ICD-10-CM | POA: Insufficient documentation

## 2018-05-08 DIAGNOSIS — Z86010 Personal history of colonic polyps: Secondary | ICD-10-CM | POA: Insufficient documentation

## 2018-05-08 DIAGNOSIS — Z7951 Long term (current) use of inhaled steroids: Secondary | ICD-10-CM | POA: Diagnosis not present

## 2018-05-08 DIAGNOSIS — Z85118 Personal history of other malignant neoplasm of bronchus and lung: Secondary | ICD-10-CM | POA: Insufficient documentation

## 2018-05-08 DIAGNOSIS — K7460 Unspecified cirrhosis of liver: Secondary | ICD-10-CM | POA: Diagnosis not present

## 2018-05-08 DIAGNOSIS — Z886 Allergy status to analgesic agent status: Secondary | ICD-10-CM | POA: Insufficient documentation

## 2018-05-08 DIAGNOSIS — Z87891 Personal history of nicotine dependence: Secondary | ICD-10-CM | POA: Insufficient documentation

## 2018-05-08 DIAGNOSIS — Z8379 Family history of other diseases of the digestive system: Secondary | ICD-10-CM | POA: Insufficient documentation

## 2018-05-08 DIAGNOSIS — J9610 Chronic respiratory failure, unspecified whether with hypoxia or hypercapnia: Secondary | ICD-10-CM | POA: Insufficient documentation

## 2018-05-08 DIAGNOSIS — Z9981 Dependence on supplemental oxygen: Secondary | ICD-10-CM | POA: Insufficient documentation

## 2018-05-08 DIAGNOSIS — Z1211 Encounter for screening for malignant neoplasm of colon: Secondary | ICD-10-CM | POA: Diagnosis present

## 2018-05-08 DIAGNOSIS — R490 Dysphonia: Secondary | ICD-10-CM | POA: Insufficient documentation

## 2018-05-08 DIAGNOSIS — Z833 Family history of diabetes mellitus: Secondary | ICD-10-CM | POA: Diagnosis not present

## 2018-05-08 HISTORY — PX: POLYPECTOMY: SHX5525

## 2018-05-08 HISTORY — PX: BIOPSY: SHX5522

## 2018-05-08 HISTORY — PX: COLONOSCOPY WITH PROPOFOL: SHX5780

## 2018-05-08 SURGERY — COLONOSCOPY WITH PROPOFOL
Anesthesia: Monitor Anesthesia Care

## 2018-05-08 MED ORDER — LACTATED RINGERS IV SOLN
INTRAVENOUS | Status: DC
  Administered 2018-05-08: 07:00:00 via INTRAVENOUS
  Administered 2018-05-08: 1000 mL via INTRAVENOUS

## 2018-05-08 MED ORDER — PROPOFOL 10 MG/ML IV BOLUS
INTRAVENOUS | Status: DC | PRN
  Administered 2018-05-08 (×4): 20 mg via INTRAVENOUS
  Administered 2018-05-08: 50 mg via INTRAVENOUS
  Administered 2018-05-08 (×7): 20 mg via INTRAVENOUS

## 2018-05-08 MED ORDER — PROPOFOL 10 MG/ML IV BOLUS
INTRAVENOUS | Status: AC
  Filled 2018-05-08: qty 40

## 2018-05-08 MED ORDER — LIDOCAINE 2% (20 MG/ML) 5 ML SYRINGE
INTRAMUSCULAR | Status: DC | PRN
  Administered 2018-05-08: 60 mg via INTRAVENOUS

## 2018-05-08 MED ORDER — SODIUM CHLORIDE 0.9 % IV SOLN: INTRAVENOUS | Status: DC

## 2018-05-08 SURGICAL SUPPLY — 21 items

## 2018-05-08 NOTE — Discharge Instructions (Signed)

## 2018-05-08 NOTE — Anesthesia Postprocedure Evaluation (Signed)
Anesthesia Post Note  Patient: Toni Moore  Procedure(s) Performed: COLONOSCOPY WITH PROPOFOL (N/A ) POLYPECTOMY BIOPSY     Patient location during evaluation: PACU Anesthesia Type: MAC Level of consciousness: awake and alert Pain management: pain level controlled Vital Signs Assessment: post-procedure vital signs reviewed and stable Respiratory status: spontaneous breathing, nonlabored ventilation, respiratory function stable and patient connected to nasal cannula oxygen Cardiovascular status: stable and blood pressure returned to baseline Postop Assessment: no apparent nausea or vomiting Anesthetic complications: no    Last Vitals:  Vitals:   05/08/18 0624 05/08/18 0808  BP: (!) 146/72 127/69  Pulse: 88   Resp: 17   Temp: 36.5 C 36.4 C  SpO2: 96% 99%    Last Pain:  Vitals:   05/08/18 0808  TempSrc: Oral  PainSc: 0-No pain                 Ladainian Therien DAVID

## 2018-05-08 NOTE — Anesthesia Procedure Notes (Signed)
Procedure Name: MAC Date/Time: 05/08/2018 7:19 AM Performed by: Cynda Familia, CRNA Pre-anesthesia Checklist: Patient identified, Emergency Drugs available, Suction available, Patient being monitored and Timeout performed Patient Re-evaluated:Patient Re-evaluated prior to induction Oxygen Delivery Method: Simple face mask Placement Confirmation: positive ETCO2 and breath sounds checked- equal and bilateral Dental Injury: Teeth and Oropharynx as per pre-operative assessment

## 2018-05-08 NOTE — Transfer of Care (Signed)
Immediate Anesthesia Transfer of Care Note  Patient: Toni Moore  Procedure(s) Performed: COLONOSCOPY WITH PROPOFOL (N/A ) POLYPECTOMY BIOPSY  Patient Location: PACU and Endoscopy Unit  Anesthesia Type:MAC  Level of Consciousness: awake and alert   Airway & Oxygen Therapy: Patient Spontanous Breathing and Patient connected to face mask oxygen  Post-op Assessment: Report given to RN and Post -op Vital signs reviewed and stable  Post vital signs: Reviewed and stable  Last Vitals:  Vitals Value Taken Time  BP 127/69 05/08/2018  8:04 AM  Temp    Pulse 81 05/08/2018  8:05 AM  Resp 21 05/08/2018  8:05 AM  SpO2 99 % 05/08/2018  8:05 AM  Vitals shown include unvalidated device data.  Last Pain:  Vitals:   05/08/18 0624  TempSrc: Oral  PainSc: 0-No pain         Complications: No apparent anesthesia complications

## 2018-05-08 NOTE — Anesthesia Preprocedure Evaluation (Addendum)
Anesthesia Evaluation  Patient identified by MRN, date of birth, ID band Patient awake    Reviewed: Allergy & Precautions, NPO status , Patient's Chart, lab work & pertinent test results  Airway Mallampati: I  TM Distance: >3 FB Neck ROM: Full    Dental  (+) Dental Advisory Given   Pulmonary COPD,  oxygen dependent, former smoker,  H/O lung CA   Pulmonary exam normal        Cardiovascular Normal cardiovascular exam     Neuro/Psych    GI/Hepatic (+) Cirrhosis       ,   Endo/Other    Renal/GU      Musculoskeletal   Abdominal   Peds  Hematology   Anesthesia Other Findings   Reproductive/Obstetrics                            Anesthesia Physical Anesthesia Plan  ASA: III  Anesthesia Plan: MAC   Post-op Pain Management:    Induction: Intravenous  PONV Risk Score and Plan: 2 and Treatment may vary due to age or medical condition  Airway Management Planned:   Additional Equipment:   Intra-op Plan:   Post-operative Plan:   Informed Consent: I have reviewed the patients History and Physical, chart, labs and discussed the procedure including the risks, benefits and alternatives for the proposed anesthesia with the patient or authorized representative who has indicated his/her understanding and acceptance.     Plan Discussed with: CRNA and Surgeon  Anesthesia Plan Comments:         Anesthesia Quick Evaluation

## 2018-05-08 NOTE — H&P (Signed)
Toni Moore is an 62 y.o. female.   Chief Complaint: Colorectal cancer screening. HPI: 62 year old white female with a history of lung cancer and COPD on home oxgyen here for a screening colonoscopy. She has had a tubular adenoma removed on a previous colonoscopy. See office notes for details.  Past Medical History:  Diagnosis Date  . Cancer ([place])    lung ca finished radaition nov 2016  . Chronic respiratory failure ([place])   . Cirrhosis of liver ([place])    new dx 2018  . COPD (chronic obstructive pulmonary disease) ([place])   . Dyspnea    with activity, uses oxygen 2 liters at hx  . Hoarseness   . Hyperlipidemia   . Pneumonia    last time few yrs ago  . Thrombocytopenia ([place])   . Unspecified chronic bronchitis ([place])   . Vitamin D deficiency   . Vocal cord cyst    Past Surgical History:  Procedure Laterality Date  . ESOPHAGOGASTRODUODENOSCOPY (EGD) WITH PROPOFOL N/A 07/25/2017   Procedure: ESOPHAGOGASTRODUODENOSCOPY (EGD) WITH PROPOFOL;  Surgeon: Juanita Craver, MD;  Location: [facility];  Service: Endoscopy;  Laterality: N/A;  . HAMMER TOE SURGERY     x3  . TUBAL LIGATION  1985  . WISDOM TOOTH EXTRACTION     Family History  Problem Relation Age of Onset  . Diabetes Unknown   . Heart disease Father   . Breast cancer Sister   . Healthy Brother   . Ulcerative colitis Brother   . Healthy Brother   . Healthy Daughter   . Other Daughter        blood dyscrasia as a young child  . Healthy Daughter   . Healthy Son    Social History:  reports that she quit smoking about 3 years ago. Her smoking use included cigarettes. She has a 5.88 pack-year smoking history. She has never used smokeless tobacco. She reports that she drinks alcohol. She reports that she does not use drugs.  Allergies:  Allergies  Allergen Reactions  . Asa [Aspirin] Other (See Comments)    Bleeds non-stop    Medications Prior to Admission  Medication Sig Dispense Refill  . albuterol (VENTOLIN HFA) 108 (90  BASE) MCG/ACT inhaler Inhale 2 puffs into the lungs every 6 (six) hours as needed (for wheezing/shortness of breath).     . betamethasone dipropionate (DIPROLENE) 0.05 % cream Apply 1 application topically 2 (two) times daily as needed. For itchy skin    . Butenafine HCl 1 % cream Apply 1 application topically daily.    . Calcium Carbonate-Vitamin D3 (CALCIUM 600+D3) 600-400 MG-UNIT TABS Take 1 tablet by mouth 2 (two) times daily.    . Cholecalciferol (VITAMIN D-3) 5000 units TABS Take 5,000 Units by mouth 3 (three) times a week.     . Fluticasone-Umeclidin-Vilant (TRELEGY ELLIPTA) 100-62.5-25 MCG/INH AEPB Inhale 1 puff into the lungs daily. 3 each 3  . magnesium oxide (MAG-OX) 400 MG tablet Take 400 mg by mouth daily.     . OXYGEN Inhale 2 L into the lungs at bedtime.    Marland Kitchen PREVIDENT 5000 DRY MOUTH 1.1 % GEL dental gel Place 1 application onto teeth daily.  5  . simvastatin (ZOCOR) 20 MG tablet Take 20 mg by mouth at bedtime.     . terbinafine (LAMISIL) 250 MG tablet Take 250 mg by mouth daily.  0  . zinc gluconate 50 MG tablet Take 50 mg by mouth 2 (two) times a week. Monday & Friday nights.  No results found for this or any previous visit (from the past 48 hour(s)). No results found.  Review of Systems  Constitutional: Negative.   HENT: Negative.   Eyes: Negative.   Respiratory: Positive for shortness of breath.   Cardiovascular: Negative.   Gastrointestinal: Negative.   Genitourinary: Negative.   Skin: Negative.   Neurological: Negative.   Endo/Heme/Allergies: Negative.   Psychiatric/Behavioral: Negative.    Blood pressure (!) 146/72, pulse 88, temperature 97.7 F (36.5 C), temperature source Oral, resp. rate 17, height 5\' 9"  (1.753 m), weight 86.2 kg (190 lb), SpO2 96 %. Physical Exam  Constitutional: She is oriented to person, place, and time. She appears well-developed and well-nourished.  HENT:  Head: Normocephalic and atraumatic.  Eyes: Conjunctivae and EOM are normal.   Neck: Normal range of motion. Neck supple.  Cardiovascular: Normal rate and regular rhythm.  Respiratory: Effort normal and breath sounds normal.  GI: Soft. Bowel sounds are normal.  Musculoskeletal: Normal range of motion.  Neurological: She is alert and oriented to person, place, and time.  Skin: Skin is warm and dry.  Psychiatric: She has a normal mood and affect. Her behavior is normal. Judgment and thought content normal.   Assessment/Plan Colorectal cancer screening/personal history of tubular adenoma: proceed with a colonoscopy at this time.  Jermy Couper, MD 05/08/2018, 7:07 AM

## 2018-05-08 NOTE — Op Note (Signed)
[facility] Patient Name: Toni Moore Procedure Date: 05/08/2018 MRN: [id number] Attending MD: Juanita Craver , MD Date of Birth: 03-22-1956 CSN: [id number] Age: 62 Admit Type: Outpatient Procedure:                Colonoscopy with cold biopsies & snare polypectomy                            x 3. Indications:              CRC screening for colorectal malignant neoplasm. Providers:                Juanita Craver, MD, Cleda Daub, RN, William Dalton, Technician, Glenis Smoker, CRNA. Referring MD:             Vicenta Aly, NP, Leanna Sato. Elsworth Soho MD. Medicines:                Monitored Anesthesia Care Complications:            No immediate complications. Estimated Blood Loss:     Estimated blood loss: minimal. Procedure:                Pre-anesthesia assessment: - Prior to the                            procedure, a history and physical was performed,                            and patient medications and allergies were                            reviewed. The patient's tolerance of previous                            anesthesia was also reviewed. The risks and                            benefits of the procedure and the sedation options                            and risks were discussed with the patient. All                            questions were answered, and informed consent was                            obtained. Prior anticoagulants: The patient has                            taken no previous anticoagulant or antiplatelet                            agents. ASA Grade Assessment: IV - A patient with  severe systemic disease that is a constant threat                            to life. After reviewing the risks and benefits,                            the patient was deemed in satisfactory condition to                            undergo the procedure. After obtaining informed                            consent,  the colonoscope was passed under direct                            vision. Throughout the procedure, the patient's                            blood pressure, pulse, and oxygen saturations were                            monitored continuously. The EC-[id number] ([id number])                            scope was introduced through the anus and advanced                            to the the cecum, identified by appendiceal orifice                            and ileocecal valve. The colonoscopy was performed                            without difficulty. The patient tolerated the                            procedure well. The quality of the bowel                            preparation was adequate. The ileocecal valve, the                            appendiceal orifice and the rectum were                            photographed. The bowel preparation used was                            GoLYTELY. Scope In: 7:23:29 AM Scope Out: 7:55:42 AM Scope Withdrawal Time: 0 hours 23 minutes 24 seconds  Total Procedure Duration: 0 hours 32 minutes 13 seconds  Findings:      Six small sessile polyps were found, 4 in the rectum and 2 in the       mid-ascending colon-these  were removed by cold biopsies.      A 6 mm sessile polyp was found in the rectum; this polyp was removed       with a hot snare x 1-200/20; resection and retrieval were complete.      Two 7-8 mm sessile polyps were found in the mid-transverse colon; these       polyps were removed by hot snare x 12-2000/0; resection and retrieval       were complete.      No additional abnormalities were found on retroflexion. Impression:               - Six small sessile polyps, 4 in the rectum and 2                            in the mid-ascending colon-removed by cold biopsies.                           - One 6 mm sessile polyp in the rectum, removed                            with a hot snare x 1; resected and retrieved.                           - Two 7-8 mm  sessile polyps in the mid- transverse                            colon, removed with a hot snare x 2; resected and                            retrieved. Moderate Sedation:      MAC used. Recommendation:           - High fiber diet with augmented water consumption                            daily.                           - Continue present medications.                           - Await pathology results.                           - Repeat colonoscopy in 3 - 5 years for                            surveillance.                           - Return to GI office PRN.                           - No Ibuprofen, Naproxen, or other non-steroidal  anti-inflammatory drugs for 2 weeks after polyp                            removal.                           - If the patient has any abnormal GI symptoms in                            the interim, she has been advised to call the                            office ASAP for further recommendations. Procedure Code(s):        --- Professional ---                           [phone], Colonoscopy, flexible; with removal of                            tumor(s), polyp(s), or other lesion(s) by snare                            technique                           [id number], 39, Colonoscopy, flexible; with biopsy,                            single or multiple Diagnosis Code(s):        --- Professional ---                           Z12.11, Encounter for screening for malignant                            neoplasm of colon                           D12.2, Benign neoplasm of ascending colon                           D12.3, Benign neoplasm of transverse colon (hepatic                            flexure or splenic flexure)                           D12.8, Benign neoplasm of rectum CPT copyright 2017 [employer]. All rights reserved. The codes documented in this report are preliminary and upon coder review may  be revised to meet  current compliance requirements. Juanita Craver, MD Juanita Craver, MD 05/08/2018 8:14:05 AM This report has been signed electronically. Number of Addenda: 0

## 2018-05-11 ENCOUNTER — Encounter (HOSPITAL_COMMUNITY): Payer: Self-pay | Admitting: Gastroenterology

## 2018-05-15 ENCOUNTER — Ambulatory Visit
Admission: RE | Admit: 2018-05-15 | Discharge: 2018-05-15 | Disposition: A | Discharge status: Home / Self Care | Payer: Managed Care, Other (non HMO) | Source: Ambulatory Visit | Attending: Urology | Admitting: Urology

## 2018-05-15 ENCOUNTER — Encounter: Payer: Self-pay | Admitting: Urology

## 2018-05-15 ENCOUNTER — Other Ambulatory Visit: Payer: Self-pay

## 2018-05-15 VITALS — BP 131/70 | HR 99 | Temp 98.0°F | Resp 20 | Ht 69.0 in | Wt 189.2 lb

## 2018-05-15 DIAGNOSIS — Y842 Radiological procedure and radiotherapy as the cause of abnormal reaction of the patient, or of later complication, without mention of misadventure at the time of the procedure: Secondary | ICD-10-CM | POA: Insufficient documentation

## 2018-05-15 DIAGNOSIS — S2242XA Multiple fractures of ribs, left side, initial encounter for closed fracture: Secondary | ICD-10-CM | POA: Diagnosis not present

## 2018-05-15 DIAGNOSIS — K7460 Unspecified cirrhosis of liver: Secondary | ICD-10-CM | POA: Diagnosis not present

## 2018-05-15 DIAGNOSIS — J984 Other disorders of lung: Secondary | ICD-10-CM | POA: Insufficient documentation

## 2018-05-15 DIAGNOSIS — R918 Other nonspecific abnormal finding of lung field: Secondary | ICD-10-CM | POA: Insufficient documentation

## 2018-05-15 DIAGNOSIS — I700 Atherosclerosis of aorta: Secondary | ICD-10-CM | POA: Insufficient documentation

## 2018-05-15 DIAGNOSIS — Z886 Allergy status to analgesic agent status: Secondary | ICD-10-CM | POA: Insufficient documentation

## 2018-05-15 DIAGNOSIS — C3412 Malignant neoplasm of upper lobe, left bronchus or lung: Secondary | ICD-10-CM | POA: Insufficient documentation

## 2018-05-15 DIAGNOSIS — D126 Benign neoplasm of colon, unspecified: Secondary | ICD-10-CM | POA: Diagnosis not present

## 2018-05-15 DIAGNOSIS — J449 Chronic obstructive pulmonary disease, unspecified: Secondary | ICD-10-CM | POA: Insufficient documentation

## 2018-05-15 DIAGNOSIS — Z9981 Dependence on supplemental oxygen: Secondary | ICD-10-CM | POA: Diagnosis not present

## 2018-05-15 DIAGNOSIS — Z79899 Other long term (current) drug therapy: Secondary | ICD-10-CM | POA: Insufficient documentation

## 2018-05-15 NOTE — Progress Notes (Signed)
Radiation Oncology         [phone]) [phone] ________________________________  Name: Toni Moore MRN: [id number]  Date: 05/15/2018  DOB: 01-26-1956  Follow-Up Visit Note  CC: Vicenta Aly, FNP  Vicenta Aly, FNP  Diagnosis:   Putative T1N0 non-small cell lung cancer of the left upper lung    ICD-10-CM   1. Malignant neoplasm of upper lobe, left bronchus or lung (HCC) C34.12     Interval Since Last Radiation:  2 years, 8 months s/p SBRT LUL lesion 09/08/2015-09/15/2015   Narrative:  The patient returns today for routine follow-up. She states that she is feeling well in general.  She denies chest pain, hemoptysis, fever, chills, N/V, night sweats or weight loss. She has chronic SOB with exertion, mild cough and wheezing which is unchanged recently.  She remains on 2L O2 via [place] at night for her underlying COPD.  She denies pain or difficulty when swallowing and reports good appetite and energy level.   She had a PET scan 05/10/17 for further evaluation of abnormal findings on CT chest from 12/2015 which suggested interval increase in size of LUL mass.  PET scan showed interval response to therapy with a significant decrease in FDG uptake in the LUL lesion and no other concerning lesions noted.  No evidence of skeletal metastasis.  There was suggestion of possible liver cirrhosis and was referred to Dr. Juanita Craver for further evaluation.  Her liver function tests remain normal, by patient report, but abdominal ultrasound did confirm cirrhotic changes within the liver, no suspicious masses but also noted gallstones.    She is followed by Dr. Elsworth Soho for management of her COPD. 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 Additionally, there were new right lower lobe nodules thought to be due to interval infection or inflammation. Dr. Elsworth Soho recommended a repeat CT Chest in 6 weeks but  this was not completed.    She had a recent follow up CT Chest on 04/24/18 which showed a stable appearance of the previously treated LUL lesion with scattered pulmonary nodules bilaterally.  There is a new cluster of 3 nodules within the periphery of the right middle lobe which measure up to 8 mm and favor postinflammatory response. The recommendation is for a short-interval follow-up with repeat CT of the chest in 3 months to ensure resolution.  She presents today to review these results.  Of note, she recently had routine screening colonoscopy on 05/08/18 with removal of 3 polyps which were found to be tubular adenoma without high-grade dysplasia or malignancy.                   ALLERGIES:  is allergic to asa [aspirin].  Meds: Current Outpatient Medications  Medication Sig Dispense Refill  . albuterol (VENTOLIN HFA) 108 (90 BASE) MCG/ACT inhaler Inhale 2 puffs into the lungs every 6 (six) hours as needed (for wheezing/shortness of breath).     . betamethasone dipropionate (DIPROLENE) 0.05 % cream Apply 1 application topically 2 (two) times daily as needed. For itchy skin    . Butenafine HCl 1 % cream Apply 1 application topically daily.    . Calcium Carbonate-Vitamin D3 (CALCIUM 600+D3) 600-400 MG-UNIT TABS Take 1 tablet by mouth 2 (two) times daily.    . Cholecalciferol (VITAMIN D-3) 5000 units TABS Take 5,000 Units by mouth 3 (three) times a week.     . Fluticasone-Umeclidin-Vilant (  TRELEGY ELLIPTA) 100-62.5-25 MCG/INH AEPB Inhale 1 puff into the lungs daily. 3 each 3  . magnesium oxide (MAG-OX) 400 MG tablet Take 400 mg by mouth daily.     . OXYGEN Inhale 2 L into the lungs at bedtime.    Marland Kitchen PREVIDENT 5000 DRY MOUTH 1.1 % GEL dental gel Place 1 application onto teeth daily.  5  . simvastatin (ZOCOR) 20 MG tablet Take 20 mg by mouth at bedtime.     . terbinafine (LAMISIL) 250 MG tablet Take 250 mg by mouth daily.    Marland Kitchen zinc gluconate 50 MG tablet Take 50 mg by mouth 2 (two) times a week.  Monday & Friday nights.     No current facility-administered medications for this encounter.     Physical Findings:  height is 5\' 9"  (1.753 m) and weight is 189 lb 3.2 oz (85.8 kg). Her oral temperature is 98 F (36.7 C). Her blood pressure is 131/70 and her pulse is 99. Her respiration is 20 and oxygen saturation is 100%.   In general this is a well appearing caucasian female in no acute distress. She's alert and oriented x4 and appropriate throughout the examination. Cardiopulmonary assessment is negative for acute distress and she exhibits normal effort.   Lab Findings: Lab Results  Component Value Date   WBC 4.1 12/09/2009   WBC 3.5 (L) 09/04/2009   HGB 14.6 12/09/2009   HCT 42.2 12/09/2009   PLT 115 (L) 12/09/2009    Lab Results  Component Value Date   NA 142 06/23/2009   K 4.2 06/23/2009   CO2 29 06/23/2009   GLUCOSE 125 (H) 06/23/2009   BUN 10 06/23/2009   CREATININE 1.01 06/23/2009   BILITOT 1.2 06/23/2009   ALKPHOS 61 06/23/2009   AST 20 06/23/2009   ALT 15 06/23/2009   PROT 6.6 06/23/2009   ALBUMIN 4.4 06/23/2009   CALCIUM 9.5 06/23/2009    Radiographic Findings: Ct Chest Wo Contrast  Result Date: 04/25/2018 CLINICAL DATA:  Followup pulmonary nodule. History of left lung cancer in 2016 treated with radiation therapy. EXAM: CT CHEST WITHOUT CONTRAST TECHNIQUE: Multidetector CT imaging of the chest was performed following the standard protocol without IV contrast. COMPARISON:  10/17/2017 FINDINGS: Cardiovascular: The heart size appears within normal limits. No pericardial effusion identified. Aortic atherosclerosis. Calcification within the RCA, LAD coronary arteries noted. Mediastinum/Nodes: No enlarged mediastinal or axillary lymph nodes. Thyroid gland, trachea, and esophagus demonstrate no significant findings. Lungs/Pleura: Advanced changes of emphysema. Stable scar within the anterior right upper lobe, image 42/3. There is a subpleural nodule within the posterior  right lower lobe stable at 4 mm, image 96/3. Anterior right lower lobe lung nodule is unchanged measuring 4 mm, image 100/3. Peripheral left lower lobe lung nodule is stable measuring 4 mm, image 124/3. There is a cluster of 3 nodules within the periphery of the right middle lobe, image 108/3 through image 116/3, which measure up to 8 mm. These nodules have a branching, tree-in-bud configuration and are favored to represent sequelae of distal bronchial impaction due to inflammatory bronchiolitis. Area of scarring within the posterior left upper lobe compatible with changes due to external beam radiation is again noted. Previously referenced nodular components to this area are again noted measuring 8 mm anteriorly, image 44/3 and 1.3 x 1.0 cm posteriorly, image 45/3. Not significantly changed from comparison exam. Upper Abdomen: No acute abnormality. Musculoskeletal: Remote left posterior rib fractures. IMPRESSION: 1. Area of scarring and radiation change within the posterior left  upper lobe appears similar to 10/17/2017. The associated internal nodular components are not significantly changed in size in the interval. 2. Scattered pulmonary nodules are again noted. Previously referenced nodules are unchanged. There is a new cluster of 3 nodules within the periphery of the right middle lobe which measure up to 8 mm. Favor postinflammatory. Advise short-term interval follow-up with repeat CT of the chest in 3 months to ensure resolution. 3. Aortic atherosclerosis and multi vessel coronary artery atherosclerotic calcifications. Aortic Atherosclerosis (ICD10-I70.0). 4.  Emphysema (ICD10-J43.9). Electronically Signed   By: Kerby Moors M.D.   On: 04/25/2018 09:32    Impression/Plan:   1. Putative T1N0 non-small cell lung cancer of the left upper lung. Recent imaging shows a stable appearance of the previously treated LUL lesion with scattered pulmonary nodules bilaterally.  However, there is a new cluster of 3  nodules within the periphery of the right middle lobe which measure up to 8 mm and favor postinflammatory response. The recommendation is for a short-interval follow-up with repeat CT of the chest in 3 months to ensure resolution.  She has a planned follow up with Dr. Elsworth Soho  in 3 months with repeat CT chest prior to that visit.  We discussed that while we are happy to continue to participate in her care if clinically indicated, at this point, since she is followed so regularly with her pulmonologist with serial imaging, we will plan to see her back on an as-needed basis if there is ever a further role for radiotherapy in the future.  She is in agreement with this plan and knows to call at anytime with any questions or concerns related to her previous treatment.  2. Liver Cirrhosis.  She continues with normal liver function labs at this point, per patient report.  She will continue disease management and surveillance under the care and guidance of Dr. Juanita Craver.  Nicholos Johns, PA-C

## 2018-05-15 NOTE — Addendum Note (Signed)
Encounter addended by: Malena Edman, RN on: 05/15/2018 4:30 PM  Actions taken: Charge Capture section accepted

## 2018-05-18 ENCOUNTER — Telehealth: Payer: Self-pay | Admitting: Adult Health

## 2018-05-18 DIAGNOSIS — R918 Other nonspecific abnormal finding of lung field: Secondary | ICD-10-CM

## 2018-05-18 DIAGNOSIS — R911 Solitary pulmonary nodule: Secondary | ICD-10-CM

## 2018-05-18 NOTE — Telephone Encounter (Signed)
LMOM TCB x1 for patient to schedule appt  Have placed order for CT to be done in August

## 2018-05-18 NOTE — Telephone Encounter (Signed)
Staff message from Rexene Edison NP:  Message  Received: [place], NP  Rinaldo Ratel, CMA        Jess  Can you set up phone message , pt needs ov in August with NP /MD after CT chest set (no contrast) follow up lung nodules.   Tam   Previous Messages    ----- Message -----  From: Freeman Caldron, PA-C  Sent: 05/15/2018  3:07 PM  To: Melvenia Needles, NP   Patient is doing excellent with overall stable CT scans, now almost 3 years out from treatment. She prefers to follow up with you all for continued serial imaging at this point and just see [place] prn should there be any further role for XRT and Dr. Tammi Klippel is in agreement. Please let me know if you prefer otherwise. Looks like she will be due for repeat Chest CT in August for further evaluation of the tiny cluster of nodules in the RML but I will not order further imaging unless you tell me otherwise. Trying to avoid duplication of efforts!  Cheers,  -Ashlyn

## 2018-05-21 NOTE — Telephone Encounter (Signed)
LMOM TCB x2

## 2018-05-28 NOTE — Telephone Encounter (Signed)
Attempted to call pt but unable to reach her.  Left message for pt to return call x3.  Due to [place] trying to contact pt multiple times and unable to reach her, will mail letter to pt's address stating pt needs to call to schedule f/u appt in August after CT scan.

## 2018-06-19 NOTE — Progress Notes (Signed)
@Patient  ID: Toni Moore, female    DOB: 05-10-1956, 62 y.o.   MRN: [id number]  Chief Complaint  Patient presents with  . Acute Visit    Non productive cough, chest pain, increased SOB, and upper back pain.     Referring provider: Vicenta Aly, FNP  HPI: 62 year old female patient followed for GOLD IV COPD, presumed lung cancer with hypermetabolic left upper lobe nodule status post SBRT 08/2015, oxygen dependent respiratory failure with nocturnal oxygen use, new lung nodule seen in right lower lobe in November/2018 Patient of Dr. Elsworth Soho  Recent [facility] Encounters:   02/13/2018-follow-up-COPD-TP Patient returns for 37-month follow-up.  Patient with very severe COPD, remains on trelegy daily, patient reporting they are also using Spiriva.  Discussed that we cannot take double LAMA medication.  Patient reports breathing is okay.  Denies any recent flares of cough or shortness of breath.  Does get short of breath walking long distance or going up stairs.  Rare [place] use Plan: Continue trelegy, stop using Spiriva, continue oxygen 2 L at bedtime, follow-up for CT chest in May/2019, follow-up with Dr. Elsworth Soho in 4 months   Tests:  Spirometry 05/2014 showed FEV1 of 0.63-20%, FVC 51% ,ratio of 30.  10/2014 ONO >>mild desaturation 1.5 h during sleep. Completed pulm rehab 12/2014 2L oxygen During sleep  ONO >>shows sawtooth pattern PSG 08/2016 neg for OSA   Imaging:  CT chest November 2018 showed posterior left upper lobe nodule with probable radiation fibrosis, emphysema, scattered pulmonary nodules to right lower lobe nodules are new  CT chest 2/2018Slightincrease in size of posterior left upper lobe pulmonary nodule. -by 71mm compared to 12/2015 PET 04/2017 >> decreased SUV from 4.5 to 1.5 >>>PET scan from June 2018 which showed decrease in hypermetabolic from consistent with resolving malignancy. 04/25/2018-CT chest without contrast- area of scarring and radiation  change within the posterior left upper lobe nodule.  Similar to 10/17/2017 CT.  Scattered pulmonary nodules are again noted, previously referenced nodules are unchanged, there is a new cluster of 3 nodules within the periphery of the right middle lobe which measure up to 8 mm, favor postinflammatory etiology, follow-up 3 repeat CT of chest in 3 months to ensure resolution, emphysema   Chart Review:      06/20/18 Acute  62 year old patient seen office today for acute follow-up visit of chest tightness, increased shortness of breath with exertion, increased work of breathing, reporting SPO2 levels in the 70s when she was at work on 06/18/18.  Patient reports that symptoms started on 06/17/2018.  Patient is still not smoking.  Patient does feel that she needs to start considering wearing oxygen during the day.  Patient reports that Dr. Elsworth Soho had already recommended that she start doing this and she denied that.  Patient is wearing 2 L at night.  Patient was initially hesitant to wear oxygen during the day because she feels that this would mean she would have to quit her job which she does not want to do.  Patient is scheduled for August/2019 CT which is a 46-month follow-up from her May CT which showed some probable postinflammatory nodules.  Patient needs follow-up with [place] after this CT.      Allergies  Allergen Reactions  . Asa [Aspirin] Other (See Comments)    Bleeds non-stop     Immunization History  Administered Date(s) Administered  . Influenza Split 09/29/2007, 06/28/2009, 09/28/2014  . Influenza, Quadrivalent, Recombinant, Inj, Pf 10/16/2016  . Influenza, Seasonal, Injecte, Preservative Fre 10/27/2014, 10/15/2015  .  Influenza,inj,Quad PF,6+ Mos 10/27/2014, 07/18/2017  . Influenza,trivalent, recombinat, inj, PF 08/13/2012, 08/15/2013  . Influenza-Unspecified 10/15/2015  . Pneumococcal Polysaccharide-23 11/28/2005, 11/15/2016  . Tdap 01/14/2010    Past Medical History:    Diagnosis Date  . Cancer ([place])    lung ca finished radaition nov 2016  . Chronic respiratory failure ([place])   . Cirrhosis of liver ([place])    new dx 2018  . COPD (chronic obstructive pulmonary disease) ([place])   . Dyspnea    with activity, uses oxygen 2 liters at hx  . Hoarseness   . Hyperlipidemia   . Pneumonia    last time few yrs ago  . Thrombocytopenia ([place])   . Unspecified chronic bronchitis ([place])   . Vitamin D deficiency   . Vocal cord cyst     Tobacco History: Social History   Tobacco Use  Smoking Status Former Smoker  . Packs/day: 0.75  . Years: 42.00  . Pack years: 31.50  . Types: Cigarettes  . Last attempt to quit: 07/29/2014  . Years since quitting: 3.8  Smokeless Tobacco Never Used   Counseling given: Yes Continue not smoking.  Great job!  Outpatient Encounter Medications as of 06/20/2018  Medication Sig  . albuterol (VENTOLIN HFA) 108 (90 BASE) MCG/ACT inhaler Inhale 2 puffs into the lungs every 6 (six) hours as needed (for wheezing/shortness of breath).   . betamethasone dipropionate (DIPROLENE) 0.05 % cream Apply 1 application topically 2 (two) times daily as needed. For itchy skin  . Calcium Carbonate-Vitamin D3 (CALCIUM 600+D3) 600-400 MG-UNIT TABS Take 1 tablet by mouth 2 (two) times daily.  . Cholecalciferol (VITAMIN D-3) 5000 units TABS Take 5,000 Units by mouth 3 (three) times a week.   . Fluticasone-Umeclidin-Vilant (TRELEGY ELLIPTA) 100-62.5-25 MCG/INH AEPB Inhale 1 puff into the lungs daily.  . magnesium oxide (MAG-OX) 400 MG tablet Take 400 mg by mouth daily.   . OXYGEN Inhale 2 L into the lungs at bedtime.  Marland Kitchen PREVIDENT 5000 DRY MOUTH 1.1 % GEL dental gel Place 1 application onto teeth daily.  . simvastatin (ZOCOR) 20 MG tablet Take 20 mg by mouth at bedtime.   . terbinafine (LAMISIL) 250 MG tablet Take 250 mg by mouth daily.  Marland Kitchen zinc gluconate 50 MG tablet Take 50 mg by mouth 2 (two) times a week. Monday & Friday nights.  . doxycycline (VIBRA-TABS)  100 MG tablet Take 1 tablet (100 mg total) by mouth 2 (two) times daily.  . predniSONE (DELTASONE) 10 MG tablet 4 tabs for 2 days, then 3 tabs for 2 days, 2 tabs for 2 days, then 1 tab for 2 days, then stop  . [DISCONTINUED] Butenafine HCl 1 % cream Apply 1 application topically daily.   No facility-administered encounter medications on file as of 06/20/2018.      Review of Systems  Review of Systems  Constitutional: Positive for fatigue. Negative for chills, fever and unexpected weight change.  HENT: Positive for congestion and postnasal drip. Negative for ear pain, rhinorrhea, sinus pressure, sinus pain, sneezing and sore throat.   Respiratory: Positive for cough (non productive, but feels wet ), chest tightness, shortness of breath and wheezing.        Denies hemoptysis   Cardiovascular: Negative for chest pain and palpitations.  Gastrointestinal: Negative for blood in stool, diarrhea, nausea and vomiting.  Genitourinary: Negative for dysuria, frequency and urgency.  Musculoskeletal: Negative for arthralgias.  Skin: Negative for color change.  Allergic/Immunologic: Negative for environmental allergies and food allergies.  Neurological:  Negative for dizziness, light-headedness and headaches.  Psychiatric/Behavioral: Negative for dysphoric mood. The patient is not nervous/anxious.   All other systems reviewed and are negative.   MMRC - Breathlessness Score 3 - I stop for breath after walking about 100 yards or after a few minutes on level ground (isle at grocery store is 163ft)    Physical Exam  BP 140/90   Pulse (!) 104   Temp 98.1 F (36.7 C) (Oral)   Ht 5\' 9"  (1.753 m)   Wt 186 lb 12.8 oz (84.7 kg)   SpO2 93%   BMI 27.59 kg/m   Wt Readings from Last 5 Encounters:  06/20/18 186 lb 12.8 oz (84.7 kg)  05/15/18 189 lb 3.2 oz (85.8 kg)  05/08/18 190 lb (86.2 kg)  02/13/18 190 lb (86.2 kg)  10/10/17 193 lb (87.5 kg)     Physical Exam  Constitutional: She is oriented  to person, place, and time and well-developed, well-nourished, and in no distress. No distress.  HENT:  Head: Normocephalic and atraumatic.  Right Ear: Hearing, tympanic membrane, external ear and ear canal normal.  Left Ear: Hearing, tympanic membrane, external ear and ear canal normal.  Nose: Nose normal. Right sinus exhibits no maxillary sinus tenderness and no frontal sinus tenderness. Left sinus exhibits no maxillary sinus tenderness and no frontal sinus tenderness.  Mouth/Throat: Uvula is midline and oropharynx is clear and moist. No oropharyngeal exudate.  Eyes: Pupils are equal, round, and reactive to light.  Neck: Normal range of motion. Neck supple. No JVD present.  Cardiovascular: Normal rate, regular rhythm and normal heart sounds.  Pulmonary/Chest: Effort normal and breath sounds normal. No accessory muscle usage. No respiratory distress. She has no decreased breath sounds. She has no wheezes (slight expiratory wheeze). She has no rhonchi.  Diminished breath sounds through out exam, air movement in all lobes  Abdominal: Soft. Bowel sounds are normal. There is no tenderness.  Musculoskeletal: Normal range of motion. She exhibits no edema.  Lymphadenopathy:    She has no cervical adenopathy.  Neurological: She is alert and oriented to person, place, and time. Gait normal.  Skin: Skin is warm and dry. She is not diaphoretic. No erythema.  Psychiatric: Mood, memory, affect and judgment normal.  Nursing note and vitals reviewed.     Lab Results:  CBC    Component Value Date/Time   WBC 4.1 12/09/2009 1046   WBC 3.5 (L) 09/04/2009 0705   RBC 4.15 12/09/2009 1046   RBC 3.95 09/04/2009 0705   HGB 14.6 12/09/2009 1046   HCT 42.2 12/09/2009 1046   PLT 115 (L) 12/09/2009 1046   MCV 101.6 (H) 12/09/2009 1046   MCH 35.1 (H) 12/09/2009 1046   MCHC 34.6 12/09/2009 1046   MCHC 34.0 09/04/2009 0705   RDW 13.5 12/09/2009 1046   LYMPHSABS 1.5 12/09/2009 1046   MONOABS 0.3 12/09/2009  1046   EOSABS 0.1 12/09/2009 1046   BASOSABS 0.0 12/09/2009 1046    BMET    Component Value Date/Time   NA 142 06/23/2009 1348   K 4.2 06/23/2009 1348   CL 103 06/23/2009 1348   CO2 29 06/23/2009 1348   GLUCOSE 125 (H) 06/23/2009 1348   BUN 10 06/23/2009 1348   CREATININE 1.01 06/23/2009 1348   CALCIUM 9.5 06/23/2009 1348    BNP No results found for: BNP  ProBNP No results found for: PROBNP  Imaging: No results found.   Assessment & Plan:   Pleasant 62 year old patient seen in office today.  Patient with COPD exacerbation.  Will treat with chest x-ray, doxycycline, prednisone taper.  Reminded patient she needs to complete follow-up CT in August.  Patient reports she was contemplating canceling August CT as she thought it was supposed to be every 6 months.  Emphasized the importance of keeping August CT as this is a follow-up from her postinflammatory nodule seen in May/2019 CT.  Patient reports understanding.  Patient reports that the 07/11/2018 date will not work for sensed on a Wednesday.  Patient to go to our patient care coordinators today to get rescheduled.  Patient needs follow-up appointment with Dr. Elsworth Soho after this CT is completed.  I emphasized the importance of the patient needs to have the CT completed at this month.  At next appointment if it is a stable interval patient can be walked for POC.  Unfortunately cannot do this today as this is an acute visit.  COPD GOLD IV B  COPD with exacerbation today  Chest x-ray today  Doxycycline >>> 1 100 mg tablet every 12 hours for 7 days >>>take with food  >>>wear sunscreen   Prednisone  >>>4 tabs for 2 days, then 3 tabs for 2 days, 2 tabs for 2 days, then 1 tab for 2 days, then stop  Go to our patient care coordinators today to reschedule your CT.  This needs to be completed in August/2019.  Schedule follow-up appointment with Dr. Elsworth Soho for after your CT to discuss these results >>> We can perform a walk at that  time to qualify for oxygen during the day  Continue not smoking   Note your daily symptoms > remember "red flags" for COPD:   >>>Increase in cough >>>increase in sputum production >>>increase in shortness of breath or activity  intolerance.   If you notice these symptoms, please call the office to be seen.   Chronic respiratory failure (HCC) Continue nocturnal oxygen 2 L Walk at next appointment for POC  History of tobacco use Continue not smoking     Lauraine Rinne, NP 06/20/2018

## 2018-06-20 ENCOUNTER — Encounter: Payer: Self-pay | Admitting: Pulmonary Disease

## 2018-06-20 ENCOUNTER — Ambulatory Visit: Payer: Managed Care, Other (non HMO) | Admitting: Pulmonary Disease

## 2018-06-20 ENCOUNTER — Ambulatory Visit (INDEPENDENT_AMBULATORY_CARE_PROVIDER_SITE_OTHER)
Admission: RE | Admit: 2018-06-20 | Discharge: 2018-06-20 | Disposition: A | Discharge status: Home / Self Care | Payer: Managed Care, Other (non HMO) | Source: Ambulatory Visit | Attending: Pulmonary Disease | Admitting: Pulmonary Disease

## 2018-06-20 VITALS — BP 140/90 | HR 104 | Temp 98.1°F | Ht 69.0 in | Wt 186.8 lb

## 2018-06-20 DIAGNOSIS — Z87891 Personal history of nicotine dependence: Secondary | ICD-10-CM

## 2018-06-20 DIAGNOSIS — J449 Chronic obstructive pulmonary disease, unspecified: Secondary | ICD-10-CM

## 2018-06-20 DIAGNOSIS — J9611 Chronic respiratory failure with hypoxia: Secondary | ICD-10-CM

## 2018-06-20 MED ORDER — DOXYCYCLINE HYCLATE 100 MG PO TABS: 100.0000 mg | ORAL_TABLET | Freq: Two times a day (BID) | ORAL | 0 refills | Status: DC

## 2018-06-20 MED ORDER — PREDNISONE 10 MG PO TABS: ORAL_TABLET | ORAL | 0 refills | Status: DC

## 2018-06-20 NOTE — Assessment & Plan Note (Signed)
Continue not smoking 

## 2018-06-20 NOTE — Progress Notes (Signed)
Your chest x-ray results of come back.  Showing no pneumonia.  Continue with plan of care as discussed in office visit today.  Keep follow-up appointment with our office as well as complete August/2019 CT.  Wyn Quaker FNP

## 2018-06-20 NOTE — Assessment & Plan Note (Signed)
COPD with exacerbation today  Chest x-ray today  Doxycycline >>> 1 100 mg tablet every 12 hours for 7 days >>>take with food  >>>wear sunscreen   Prednisone  >>>4 tabs for 2 days, then 3 tabs for 2 days, 2 tabs for 2 days, then 1 tab for 2 days, then stop  Go to our patient care coordinators today to reschedule your CT.  This needs to be completed in August/2019.  Schedule follow-up appointment with Dr. Elsworth Soho for after your CT to discuss these results >>> We can perform a walk at that time to qualify for oxygen during the day  Continue not smoking   Note your daily symptoms > remember "red flags" for COPD:   >>>Increase in cough >>>increase in sputum production >>>increase in shortness of breath or activity  intolerance.   If you notice these symptoms, please call the office to be seen.

## 2018-06-20 NOTE — Assessment & Plan Note (Signed)
Continue nocturnal oxygen 2 L Walk at next appointment for POC

## 2018-06-20 NOTE — Patient Instructions (Addendum)
Chest x-ray today  Doxycycline >>> 1 100 mg tablet every 12 hours for 7 days >>>take with food  >>>wear sunscreen   Prednisone  >>>4 tabs for 2 days, then 3 tabs for 2 days, 2 tabs for 2 days, then 1 tab for 2 days, then stop  Go to our patient care coordinators today to reschedule your CT.  This needs to be completed in August/2019.  Schedule follow-up appointment with Dr. Elsworth Soho for after your CT to discuss these results >>> We can perform a walk at that time to qualify for oxygen during the day  Continue not smoking   Note your daily symptoms > remember "red flags" for COPD:   >>>Increase in cough >>>increase in sputum production >>>increase in shortness of breath or activity  intolerance.   If you notice these symptoms, please call the office to be seen.    Please contact the office if your symptoms worsen or you have concerns that you are not improving.   Thank you for choosing [facility] for your healthcare, and for allowing [place] to partner with you on your healthcare journey. I am thankful to be able to provide care to you today.   Wyn Quaker FNP-C               Chronic Obstructive Pulmonary Disease Chronic obstructive pulmonary disease (COPD) is a long-term (chronic) lung problem. When you have COPD, it is hard for air to get in and out of your lungs. The way your lungs work will never return to normal. Usually the condition gets worse over time. There are things you can do to keep yourself as healthy as possible. Your doctor may treat your condition with:  Medicines.  Quitting smoking, if you smoke.  Rehabilitation. This may involve a team of specialists.  Oxygen.  Exercise and changes to your diet.  Lung surgery.  Comfort measures (palliative care).  Follow these instructions at home: Medicines  Take over-the-counter and prescription medicines only as told by your doctor.  Talk to your doctor before taking any cough or allergy  medicines. You may need to avoid medicines that cause your lungs to be dry. Lifestyle  If you smoke, stop. Smoking makes the problem worse. If you need help quitting, ask your doctor.  Avoid being around things that make your breathing worse. This may include smoke, chemicals, and fumes.  Stay active, but remember to also rest.  Learn and use tips on how to relax.  Make sure you get enough sleep. Most adults need at least 7 hours a night.  Eat healthy foods. Eat smaller meals more often. Rest before meals. Controlled breathing  Learn and use tips on how to control your breathing as told by your doctor. Try: ? Breathing in (inhaling) through your nose for 1 second. Then, pucker your lips and breath out (exhale) through your lips for 2 seconds. ? Putting one hand on your belly (abdomen). Breathe in slowly through your nose for 1 second. Your hand on your belly should move out. Pucker your lips and breathe out slowly through your lips. Your hand on your belly should move in as you breathe out. Controlled coughing  Learn and use controlled coughing to clear mucus from your lungs. The steps are: 1. Lean your head a little forward. 2. Breathe in deeply. 3. Try to hold your breath for 3 seconds. 4. Keep your mouth slightly open while coughing 2 times. 5. Spit any mucus out into a tissue. 6. Rest  and do the steps again 1 or 2 times as needed. General instructions  Make sure you get all the shots (vaccines) that your doctor recommends. Ask your doctor about a flu shot and a pneumonia shot.  Use oxygen therapy and therapy to help improve your lungs (pulmonary rehabilitation) if told by your doctor. If you need home oxygen therapy, ask your doctor if you should buy a tool to measure your oxygen level (oximeter).  Make a COPD action plan with your doctor. This helps you know what to do if you feel worse than usual.  Manage any other conditions you have as told by your doctor.  Avoid going  outside when it is very hot, cold, or humid.  Avoid people who have a sickness you can catch (contagious).  Keep all follow-up visits as told by your doctor. This is important. Contact a doctor if:  You cough up more mucus than usual.  There is a change in the color or thickness of the mucus.  It is harder to breathe than usual.  Your breathing is faster than usual.  You have trouble sleeping.  You need to use your medicines more often than usual.  You have trouble doing your normal activities such as getting dressed or walking around the house. Get help right away if:  You have shortness of breath while resting.  You have shortness of breath that stops you from: ? Being able to talk. ? Doing normal activities.  Your chest hurts for longer than 5 minutes.  Your skin color is more blue than usual.  Your pulse oximeter shows that you have low oxygen for longer than 5 minutes.  You have a fever.  You feel too tired to breathe normally. Summary  Chronic obstructive pulmonary disease (COPD) is a long-term lung problem.  The way your lungs work will never return to normal. Usually the condition gets worse over time. There are things you can do to keep yourself as healthy as possible.  Take over-the-counter and prescription medicines only as told by your doctor.  If you smoke, stop. Smoking makes the problem worse. This information is not intended to replace advice given to you by your health care provider. Make sure you discuss any questions you have with your health care provider. Document Released: 05/02/2008 Document Revised: 04/21/2016 Document Reviewed: 07/11/2013 Elsevier Interactive Patient Education  2017 [employer].

## 2018-06-29 NOTE — Progress Notes (Signed)
Reviewed & agree with plan  

## 2018-07-10 ENCOUNTER — Ambulatory Visit (INDEPENDENT_AMBULATORY_CARE_PROVIDER_SITE_OTHER)
Admission: RE | Admit: 2018-07-10 | Discharge: 2018-07-10 | Disposition: A | Discharge status: Home / Self Care | Payer: Managed Care, Other (non HMO) | Source: Ambulatory Visit | Attending: Adult Health | Admitting: Adult Health

## 2018-07-10 DIAGNOSIS — R918 Other nonspecific abnormal finding of lung field: Secondary | ICD-10-CM

## 2018-07-10 DIAGNOSIS — R911 Solitary pulmonary nodule: Secondary | ICD-10-CM

## 2018-07-11 ENCOUNTER — Other Ambulatory Visit: Payer: Managed Care, Other (non HMO)

## 2018-07-16 NOTE — Progress Notes (Signed)
@Patient  ID: Toni Moore, female    DOB: [birth year], 62 y.o.   MRN: [id number]  Chief Complaint  Patient presents with  . Follow-up    Needs to discuss CT results    Referring provider: Vicenta Aly, FNP  HPI: 62 year old female patient followed in our office for GOLD IV COPD, presumed lung cancer with hypermetabolic left upper lobe nodule status post SBRT 08/2015, oxygen dependent respiratory failure with nocturnal oxygen use, new lung nodule seen in right lower lobe in November/2018   Smoker/ Smoking History: Former smoker. Quit 2015. 31.5 pack year history.  Maintenance:  Trelegy Ellipta  Pt of: Dr. Elsworth Soho  Recent [facility] Encounters:   02/13/2018-follow-up-COPD-TP Patient returns for 61-month follow-up.  Patient with very severe COPD, remains on trelegy daily, patient reporting they are also using Spiriva.  Discussed that we cannot take double LAMA medication.  Patient reports breathing is okay.  Denies any recent flares of cough or shortness of breath.  Does get short of breath walking long distance or going up stairs.  Rare [place] use Plan: Continue trelegy, stop using Spiriva, continue oxygen 2 L at bedtime, follow-up for CT chest in May/2019, follow-up with Dr. Elsworth Soho in 4 months  06/20/18- Acute -BM 62 year old patient seen office today for acute follow-up visit of chest tightness, increased shortness of breath with exertion, increased work of breathing, reporting SPO2 levels in the 70s when she was at work on 06/18/18.  Patient reports that symptoms started on 06/17/2018. Patient is still not smoking. Patient does feel that she needs to start considering wearing oxygen during the day.  Patient reports that Dr. Elsworth Soho had already recommended that she start doing this and she denied that.  Patient is wearing 2 L at night.  Patient was initially hesitant to wear oxygen during the day because she feels that this would mean she would have to quit her job which she does not want  to do. Patient is scheduled for August/2019 CT which is a 54-month follow-up from her May CT which showed some probable postinflammatory nodules.  Patient needs follow-up with [place] after this CT. Plan:  COPD exacerbation- doxycycline, prednisone taper, chest x-ray, patient needs to follow-up with CT, walk at next office for POC      07/17/2018  - Visit   62 year old patient seen today for follow-up visit.  Patient reports her breathing has significantly improved from last office visit when she was treated with a COPD exacerbation.  Patient recently completed a CT without contrast and she is presenting today for those results.   Allergies  Allergen Reactions  . Asa [Aspirin] Other (See Comments)    Bleeds non-stop     Immunization History  Administered Date(s) Administered  . Influenza Split 09/29/2007, 06/28/2009, 09/28/2014  . Influenza, Quadrivalent, Recombinant, Inj, Pf 10/16/2016  . Influenza, Seasonal, Injecte, Preservative Fre 10/27/2014, 10/15/2015  . Influenza,inj,Quad PF,6+ Mos 10/27/2014, 07/18/2017  . Influenza,trivalent, recombinat, inj, PF 08/13/2012, 08/15/2013  . Influenza-Unspecified 10/15/2015  . Pneumococcal Polysaccharide-23 11/28/2005, 11/15/2016  . Tdap 01/14/2010    Patient [facility] flu vaccine [facility]  Past Medical History:  Diagnosis Date  . Cancer ([place])    lung ca finished radaition nov 2016  . Chronic respiratory failure ([place])   . Cirrhosis of liver ([place])    new dx 2018  . COPD (chronic obstructive pulmonary disease) ([place])   . Dyspnea    with activity, uses oxygen 2 liters at hx  . Hoarseness   . Hyperlipidemia   .  Pneumonia    last time few yrs ago  . Thrombocytopenia ([place])   . Unspecified chronic bronchitis ([place])   . Vitamin D deficiency   . Vocal cord cyst     Tobacco History: Social History   Tobacco Use  Smoking Status Former Smoker  . Packs/day: 0.75  . Years: 42.00  . Pack years: 31.50  . Types: Cigarettes  . Last attempt to quit:  07/29/2014  . Years since quitting: 3.9  Smokeless Tobacco Never Used   Counseling given: Not Answered Continue not smoking  Outpatient Encounter Medications as of 07/17/2018  Medication Sig  . albuterol (VENTOLIN HFA) 108 (90 Base) MCG/ACT inhaler Inhale 2 puffs into the lungs every 6 (six) hours as needed (for wheezing/shortness of breath).  . betamethasone dipropionate (DIPROLENE) 0.05 % cream Apply 1 application topically 2 (two) times daily as needed. For itchy skin  . Calcium Carbonate-Vitamin D3 (CALCIUM 600+D3) 600-400 MG-UNIT TABS Take 1 tablet by mouth 2 (two) times daily.  . Cholecalciferol (VITAMIN D-3) 5000 units TABS Take 5,000 Units by mouth 3 (three) times a week.   . Fluticasone-Umeclidin-Vilant (TRELEGY ELLIPTA) 100-62.5-25 MCG/INH AEPB Inhale 1 puff into the lungs daily.  . magnesium oxide (MAG-OX) 400 MG tablet Take 400 mg by mouth daily.   . OXYGEN Inhale 2 L into the lungs at bedtime.  Marland Kitchen PREVIDENT 5000 DRY MOUTH 1.1 % GEL dental gel Place 1 application onto teeth daily.  . simvastatin (ZOCOR) 20 MG tablet Take 20 mg by mouth at bedtime.   . terbinafine (LAMISIL) 250 MG tablet Take 250 mg by mouth daily.  Marland Kitchen zinc gluconate 50 MG tablet Take 50 mg by mouth 2 (two) times a week. Monday & Friday nights.  . [DISCONTINUED] albuterol (VENTOLIN HFA) 108 (90 BASE) MCG/ACT inhaler Inhale 2 puffs into the lungs every 6 (six) hours as needed (for wheezing/shortness of breath).   . [DISCONTINUED] Fluticasone-Umeclidin-Vilant (TRELEGY ELLIPTA) 100-62.5-25 MCG/INH AEPB Inhale 1 puff into the lungs daily.  . [DISCONTINUED] doxycycline (VIBRA-TABS) 100 MG tablet Take 1 tablet (100 mg total) by mouth 2 (two) times daily. (Patient not taking: Reported on 07/17/2018)  . [DISCONTINUED] predniSONE (DELTASONE) 10 MG tablet 4 tabs for 2 days, then 3 tabs for 2 days, 2 tabs for 2 days, then 1 tab for 2 days, then stop (Patient not taking: Reported on 07/17/2018)   No facility-administered encounter  medications on file as of 07/17/2018.      Review of Systems  Review of Systems  Constitutional: Negative for chills, fatigue, fever and unexpected weight change.  HENT: Negative for congestion, ear pain, postnasal drip and rhinorrhea.   Respiratory: Positive for shortness of breath (improved ). Negative for cough, chest tightness and wheezing.   Cardiovascular: Negative for chest pain and palpitations.  Gastrointestinal: Negative for blood in stool, diarrhea, nausea and vomiting.  Genitourinary: Negative for dysuria, frequency and urgency.  Musculoskeletal: Negative for arthralgias.  Skin: Negative for color change.  Allergic/Immunologic: Negative for environmental allergies and food allergies.  Neurological: Negative for dizziness, light-headedness and headaches.  Psychiatric/Behavioral: Negative for dysphoric mood. The patient is not nervous/anxious.     MMRC - Breathlessness Score 2 - on level ground, I walk slower than people of the same age because of breathlessness, or have to stop for breathe when walking to my own pace     Physical Exam  BP 122/88   Pulse 87   Ht 5\' 8"  (1.727 m)   Wt 186 lb 9.6 oz (84.6 kg)  SpO2 95%   BMI 28.37 kg/m   Wt Readings from Last 5 Encounters:  07/17/18 186 lb 9.6 oz (84.6 kg)  06/20/18 186 lb 12.8 oz (84.7 kg)  05/15/18 189 lb 3.2 oz (85.8 kg)  05/08/18 190 lb (86.2 kg)  02/13/18 190 lb (86.2 kg)     Physical Exam  Constitutional: She is oriented to person, place, and time and well-developed, well-nourished, and in no distress. No distress.  HENT:  Head: Normocephalic and atraumatic.  Right Ear: Hearing, tympanic membrane, external ear and ear canal normal.  Left Ear: Hearing, tympanic membrane, external ear and ear canal normal.  Mouth/Throat: Uvula is midline and oropharynx is clear and moist. No oropharyngeal exudate.  Eyes: Pupils are equal, round, and reactive to light.  Neck: Normal range of motion. Neck supple. No JVD  present.  Cardiovascular: Normal rate, regular rhythm and normal heart sounds.  Pulmonary/Chest: Effort normal and breath sounds normal. No accessory muscle usage. No respiratory distress. She has no decreased breath sounds. She has no wheezes. She has no rhonchi.  Abdominal: Soft. Bowel sounds are normal. There is no tenderness.  Musculoskeletal: Normal range of motion. She exhibits no edema.  Lymphadenopathy:    She has no cervical adenopathy.  Neurological: She is alert and oriented to person, place, and time. Gait normal.  Skin: Skin is warm and dry. She is not diaphoretic. No erythema.  Psychiatric: Mood, memory, affect and judgment normal.  Nursing note and vitals reviewed.     Lab Results:  CBC    Component Value Date/Time   WBC 4.1 12/09/2009 1046   WBC 3.5 (L) 09/04/2009 0705   RBC 4.15 12/09/2009 1046   RBC 3.95 09/04/2009 0705   HGB 14.6 12/09/2009 1046   HCT 42.2 12/09/2009 1046   PLT 115 (L) 12/09/2009 1046   MCV 101.6 (H) 12/09/2009 1046   MCH 35.1 (H) 12/09/2009 1046   MCHC 34.6 12/09/2009 1046   MCHC 34.0 09/04/2009 0705   RDW 13.5 12/09/2009 1046   LYMPHSABS 1.5 12/09/2009 1046   MONOABS 0.3 12/09/2009 1046   EOSABS 0.1 12/09/2009 1046   BASOSABS 0.0 12/09/2009 1046    BMET    Component Value Date/Time   NA 142 06/23/2009 1348   K 4.2 06/23/2009 1348   CL 103 06/23/2009 1348   CO2 29 06/23/2009 1348   GLUCOSE 125 (H) 06/23/2009 1348   BUN 10 06/23/2009 1348   CREATININE 1.01 06/23/2009 1348   CALCIUM 9.5 06/23/2009 1348    BNP No results found for: BNP  ProBNP No results found for: PROBNP  Imaging: Dg Chest 2 View  Result Date: 06/20/2018 CLINICAL DATA:  Chest tightness, cough. COPD. History of lung cancer. EXAM: CHEST - 2 VIEW COMPARISON:  Chest CT 04/24/2018 FINDINGS: Hyperinflation of the lungs compatible with COPD. Biapical scarring. Nodular density in the left upper lobe again noted, likely not significantly changed since prior CT. No  acute confluent airspace opacities or effusions. Heart is normal size. No acute bony abnormality. IMPRESSION: Advanced COPD. Biapical scarring. Stable nodular density in the left upper lobe compatible with patient's history of lung cancer. No active disease. Electronically Signed   By: Rolm Baptise M.D.   On: 06/20/2018 10:42   Ct Chest Wo Contrast  Result Date: 07/10/2018 CLINICAL DATA:  Follow-up lung nodule. EXAM: CT CHEST WITHOUT CONTRAST TECHNIQUE: Multidetector CT imaging of the chest was performed following the standard protocol without IV contrast. COMPARISON:  04/24/2018 FINDINGS: Cardiovascular: The heart size appears normal.  No pericardial effusion. Aortic atherosclerosis. Calcifications within the RCA, LAD coronary arteries noted. Mediastinum/Nodes: No enlarged mediastinal or axillary lymph nodes. Thyroid gland, trachea, and esophagus demonstrate no significant findings. Lungs/Pleura: Moderate to advanced changes of emphysema identified. Scarring with architectural distortion within the posterior left upper lobe is again noted. Within the posterior left upper lobe previously noted treated tumor measures 2.0 x 2.8 cm, similar in size to the comparison exam from 04/24/2018. There is a new subpleural nodular density within the superior segment of the left lower lobe which measures 1.0 x 0.8 cm, image 58/3. 4 mm left lower lobe lung nodule is unchanged, image 125/3. Stable 3 mm nodule within the posterolateral left lower lobe, image 98/3. Scattered right lung nodules are stable from previous exam including 4 mm right middle lobe nodule, image 102/3. New right middle lobe lung nodule is identified measuring 5 mm, image 113/3. Upper Abdomen: Advanced changes of cirrhosis noted. No acute abnormality identified. Musculoskeletal: Spondylosis noted within the thoracic spine. Chronic left posterior rib fractures noted. No acute or significant osseous abnormality identified. IMPRESSION: 1. No significant change  in the appearance of treated tumor within the posterior left upper lobe. 2. Scattered pulmonary nodules are noted throughout both lungs the majority of these are stable in the interval. There is a new nodule within the perifissural aspect of the superior segment of left lower lobe measures 1 cm and there is a new nodule in the right middle lobe measuring 5 mm. Attention in these nodules on follow-up imaging is advised. 3. Aortic Atherosclerosis (ICD10-I70.0) and Emphysema (ICD10-J43.9). 4. Multi vessel coronary artery atherosclerotic calcifications 5. Cirrhosis of the liver. Electronically Signed   By: Kerby Moors M.D.   On: 07/10/2018 15:34       Tests:  Spirometry 05/2014 showed FEV1 of 0.63-20%, FVC 51% ,ratio of 30.  10/2014 ONO >>mild desaturation 1.5 h during sleep. Completed pulm rehab 12/2014 2L oxygen During sleep  ONO >>shows sawtooth pattern PSG 08/2016 neg for OSA   Imaging:  CT chest November 2018 showed posterior left upper lobe nodule with probable radiation fibrosis, emphysema, scattered pulmonary nodules to right lower lobe nodules are new  CT chest 2/2018Slightincrease in size of posterior left upper lobe pulmonary nodule. -by 43mm compared to 12/2015 PET 04/2017 >> decreased SUV from 4.5 to 1.5 >>>PET scan from June 2018 which showed decrease in hypermetabolic from consistent with resolving malignancy. 04/25/2018-CT chest without contrast- area of scarring and radiation change within the posterior left upper lobe nodule.  Similar to 10/17/2017 CT.  Scattered pulmonary nodules are again noted, previously referenced nodules are unchanged, there is a new cluster of 3 nodules within the periphery of the right middle lobe which measure up to 8 mm, favor postinflammatory etiology, follow-up 3 repeat CT of chest in 3 months to ensure resolution, emphysema 07/10/2018-CT chest without contrast-no significant change in appearance of treated tumor within the posterior left  upper lobe, scattered pulmonary nodules are noted throughout both lungs majority of these are stable, there is a new 1 cm nodule in the left lower lobe as well as a new right middle lobe nodule measuring 5 mm      Chart Review:      Assessment & Plan:   Pleasant 62 year old patient seen in office visit today.  Discussed CT results with patient.  Discussed PET versus follow-up CT.  Patient would like to do a repeat CT in November/2019.  Pt does not want to do PET at this point in  time.  Patient is asymptomatic.  I discussed with patient that if symptoms worsen or if she changes her mind to contact our office for more than happy to change plan of care and treatment plan or answer any questions that she may have.  Patient plans to purchase POC out-of-pocket.  Patient plans on getting flu vaccine from local pharmacy as she gets give cards to complete it there.  71-month follow-up with completion of CT without contrast.  COPD GOLD IV B  Trelegy Ellipta  >>> 1 puff daily in the morning >>>rinse mouth out after use  >>> This inhaler contains 3 medications that help manage her respiratory status, contact our office if you cannot afford this medication or unable to remain on this medication  [facility]  >>> Please receive before middle of October/2019  CT without contrast in NOV/2019 >>> This is to follow lung nodule seen on August/2019 CT scan  Follow-up with Dr. Elsworth Soho in 3 months     Chronic respiratory failure ([place]) Walk test ordered to be completed at [facility] >>> Patient can also elect to purchase a POC out right >>> Check oxygen levels to ensure remaining greater than 90%  Follow-up with Dr. Elsworth Soho in 3 months     History of tobacco use Continue not smoking   Abnormal finding on lung imaging  CT without contrast in NOV/2019 >>> This is to follow lung nodule seen on August/2019 CT scan  Follow-up with Dr. Elsworth Soho in 3 months        Lauraine Rinne,  NP 07/17/2018

## 2018-07-17 ENCOUNTER — Encounter: Payer: Self-pay | Admitting: Pulmonary Disease

## 2018-07-17 ENCOUNTER — Ambulatory Visit: Payer: Managed Care, Other (non HMO) | Admitting: Pulmonary Disease

## 2018-07-17 VITALS — BP 122/88 | HR 87 | Ht 68.0 in | Wt 186.6 lb

## 2018-07-17 DIAGNOSIS — Z87891 Personal history of nicotine dependence: Secondary | ICD-10-CM

## 2018-07-17 DIAGNOSIS — J449 Chronic obstructive pulmonary disease, unspecified: Secondary | ICD-10-CM | POA: Diagnosis not present

## 2018-07-17 DIAGNOSIS — R911 Solitary pulmonary nodule: Secondary | ICD-10-CM | POA: Diagnosis not present

## 2018-07-17 DIAGNOSIS — J9611 Chronic respiratory failure with hypoxia: Secondary | ICD-10-CM

## 2018-07-17 DIAGNOSIS — Z0000 Encounter for general adult medical examination without abnormal findings: Secondary | ICD-10-CM | POA: Insufficient documentation

## 2018-07-17 DIAGNOSIS — R918 Other nonspecific abnormal finding of lung field: Secondary | ICD-10-CM

## 2018-07-17 MED ORDER — ALBUTEROL SULFATE HFA 108 (90 BASE) MCG/ACT IN AERS: 2.0000 | INHALATION_SPRAY | Freq: Four times a day (QID) | RESPIRATORY_TRACT | 3 refills | Status: DC | PRN

## 2018-07-17 MED ORDER — FLUTICASONE-UMECLIDIN-VILANT 100-62.5-25 MCG/INH IN AEPB: 1.0000 | INHALATION_SPRAY | Freq: Every day | RESPIRATORY_TRACT | 3 refills | Status: DC

## 2018-07-17 NOTE — Assessment & Plan Note (Signed)
Trelegy Ellipta  >>> 1 puff daily in the morning >>>rinse mouth out after use  >>> This inhaler contains 3 medications that help manage her respiratory status, contact our office if you cannot afford this medication or unable to remain on this medication  [facility]  >>> Please receive before middle of October/2019  CT without contrast in NOV/2019 >>> This is to follow lung nodule seen on August/2019 CT scan  Follow-up with Dr. Elsworth Soho in 3 months

## 2018-07-17 NOTE — Assessment & Plan Note (Signed)
Continue not smoking 

## 2018-07-17 NOTE — Assessment & Plan Note (Signed)
  CT without contrast in NOV/2019 >>> This is to follow lung nodule seen on August/2019 CT scan  Follow-up with Dr. Elsworth Soho in 3 months

## 2018-07-17 NOTE — Assessment & Plan Note (Signed)
Walk test ordered to be completed at [facility] >>> Patient can also elect to purchase a POC out right >>> Check oxygen levels to ensure remaining greater than 90%  Follow-up with Dr. Elsworth Soho in 3 months

## 2018-07-17 NOTE — Progress Notes (Signed)
Patient seen office today.  Patient would like to follow this with a CT chest without contrast in November/2019.  Patient would not like to have a PET scan at this time.  Explained to patient that if she changes her mind or has additional questions to follow-up with our office.  Patient wanted a 27-month follow-up with Dr. Elsworth Soho after completing CT chest without contrast.  Patient is asymptomatic at this time.  Aaron Edelman

## 2018-07-17 NOTE — Patient Instructions (Addendum)
Walk test ordered to be completed at DME company >>> Patient can also elect to purchase a POC out right >>> Check oxygen levels to ensure remaining greater than 90%  [facility]  >>> Please receive before middle of October/2019  CT without contrast in NOV/2019 >>> This is to follow lung nodule seen on August/2019 CT scan  Follow-up with Dr. Elsworth Soho in 3 months    Please contact the office if your symptoms worsen or you have concerns that you are not improving.   Thank you for choosing [facility] for your healthcare, and for allowing [place] to partner with you on your healthcare journey. I am thankful to be able to provide care to you today.   Wyn Quaker FNP-C

## 2018-08-16 ENCOUNTER — Telehealth: Payer: Self-pay | Admitting: Pulmonary Disease

## 2018-08-16 NOTE — Telephone Encounter (Signed)
Spoke with Merrilee Seashore, he is requesting OV supporting the patient's need for oxygen. OV faxed. Nothing further needed at this time.

## 2018-08-16 NOTE — Telephone Encounter (Signed)
I called Toni Moore but there was no answer. I left a message to call back.   Dr. Elsworth Soho is it ok to write an order for travel POC? Please advise.

## 2018-10-16 ENCOUNTER — Ambulatory Visit: Payer: Managed Care, Other (non HMO) | Admitting: Pulmonary Disease

## 2018-10-16 ENCOUNTER — Ambulatory Visit (INDEPENDENT_AMBULATORY_CARE_PROVIDER_SITE_OTHER)
Admission: RE | Admit: 2018-10-16 | Discharge: 2018-10-16 | Disposition: A | Discharge status: Home / Self Care | Payer: Managed Care, Other (non HMO) | Source: Ambulatory Visit | Attending: Pulmonary Disease | Admitting: Pulmonary Disease

## 2018-10-16 DIAGNOSIS — R911 Solitary pulmonary nodule: Secondary | ICD-10-CM

## 2018-10-17 NOTE — Progress Notes (Signed)
Patient's chest CT results have come back. Still showing persistent severe emphysema as well as the previously treated tumor within the left upper lobe.  Which remains unchanged in size. Good news previously seen subpleural nodule in the left lower lobe has decreased in size in the left lower lobe lung nodules are unchanged.  This is great news.  CT chest results: 1. New 5 mm pulmonary nodule in the right middle lobe on the prior scan has resolved. New subpleural nodule in the left lower lobe on the prior scan has decreased in size, previously 10 mm, now 5 mm. Findings are most consistent with benign infectious/inflammatory etiology. 2. Other small bilateral pulmonary nodules measuring up to 4 mm are unchanged. 3. Unchanged appearance of treated tumor within the left upper lobe. 4.  Emphysema (ICD10-J43.9). 5. Aortic atherosclerosis (ICD10-I70.0). 6. Cirrhosis.  Keep follow-up with Dr. Elsworth Soho and he can further go over your CT at that time.  Wyn Quaker, FNP

## 2018-10-22 ENCOUNTER — Ambulatory Visit: Payer: Managed Care, Other (non HMO) | Admitting: Pulmonary Disease

## 2018-11-06 ENCOUNTER — Ambulatory Visit (INDEPENDENT_AMBULATORY_CARE_PROVIDER_SITE_OTHER): Payer: Managed Care, Other (non HMO) | Admitting: Pulmonary Disease

## 2018-11-06 ENCOUNTER — Encounter: Payer: Self-pay | Admitting: Pulmonary Disease

## 2018-11-06 DIAGNOSIS — C3412 Malignant neoplasm of upper lobe, left bronchus or lung: Secondary | ICD-10-CM | POA: Diagnosis not present

## 2018-11-06 DIAGNOSIS — J432 Centrilobular emphysema: Secondary | ICD-10-CM

## 2018-11-06 DIAGNOSIS — J9611 Chronic respiratory failure with hypoxia: Secondary | ICD-10-CM
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
## 2018-11-06 NOTE — Patient Instructions (Signed)
rx for nebulizer x 1 & albuterol nebs q 6h as needed #30  Ct chest without contrast in 1 year

## 2018-11-06 NOTE — Assessment & Plan Note (Signed)
She will continue on Trelegy. I will provide her with nebulizer and albuterol nebs to use on an as-needed basis

## 2018-11-06 NOTE — Assessment & Plan Note (Signed)
Nodules that have cropped up have all been inflammatory.  Last CT scan shows stable or decreased size of nodules. Will obtain 1 year follow-up CT chest without contrast 09/2019

## 2018-11-06 NOTE — Assessment & Plan Note (Signed)
Continue oxygen during sleep and PRN daytime

## 2018-12-17 ENCOUNTER — Telehealth: Payer: Self-pay | Admitting: Pulmonary Disease

## 2018-12-17 DIAGNOSIS — J432 Centrilobular emphysema: Secondary | ICD-10-CM

## 2018-12-17 NOTE — Telephone Encounter (Signed)
Spoke with the pt  She states still has not received her neb machine  Looks like the order was never even placed  I apologized for this  She wants the machine from [facility]  Order sent to [facility]

## 2018-12-31 ENCOUNTER — Telehealth: Payer: Self-pay | Admitting: Pulmonary Disease

## 2018-12-31 ENCOUNTER — Encounter: Payer: Self-pay | Admitting: Nurse Practitioner

## 2018-12-31 ENCOUNTER — Ambulatory Visit: Payer: BLUE CROSS/BLUE SHIELD | Admitting: Nurse Practitioner

## 2018-12-31 VITALS — BP 110/72 | HR 118 | Temp 98.5°F | Ht 68.0 in | Wt 190.0 lb

## 2018-12-31 DIAGNOSIS — J449 Chronic obstructive pulmonary disease, unspecified: Secondary | ICD-10-CM

## 2018-12-31 DIAGNOSIS — J069 Acute upper respiratory infection, unspecified: Secondary | ICD-10-CM | POA: Insufficient documentation

## 2018-12-31 MED ORDER — DOXYCYCLINE HYCLATE 100 MG PO TABS: 100.0000 mg | ORAL_TABLET | Freq: Two times a day (BID) | ORAL | 0 refills | Status: DC

## 2018-12-31 MED ORDER — ALBUTEROL SULFATE HFA 108 (90 BASE) MCG/ACT IN AERS: 2.0000 | INHALATION_SPRAY | Freq: Four times a day (QID) | RESPIRATORY_TRACT | 3 refills | Status: DC | PRN

## 2018-12-31 MED ORDER — ALBUTEROL SULFATE (2.5 MG/3ML) 0.083% IN NEBU: 2.5000 mg | INHALATION_SOLUTION | Freq: Four times a day (QID) | RESPIRATORY_TRACT | 12 refills | Status: DC | PRN

## 2018-12-31 MED ORDER — PREDNISONE 10 MG PO TABS: ORAL_TABLET | ORAL | 0 refills | Status: DC

## 2018-12-31 NOTE — Telephone Encounter (Signed)
Pt has been scheduled for acute visit on 12/31/18 with [place]. Rx for albuterol HFA has been sent to preferred pharmacy. Nothing further is needed.

## 2018-12-31 NOTE — Assessment & Plan Note (Signed)
Will treat for URI.  Patient is concerned about developing pneumonia because of her past history with pneumonia.  Patient Instructions  Will order doxycycline Will order prednisone taper May take mucinex twice daily Albuterol neb ordered  Continue trelegy Follow up with Dr. Elsworth Soho as scheduled or sooner if needed

## 2018-12-31 NOTE — Telephone Encounter (Signed)
Primary Pulmonologist: RA Last office visit and with whom: 11/06/18 with RA What do we see them for (pulmonary problems): emphysema.  Last OV assessment/plan: rx for nebulizer x 1 & albuterol nebs q 6h as needed  Was appointment offered to patient (explain)?  yes   Reason for call: pt states she has developed a cold and is concerned that it may turn into PNA. Pt reports of sore throat, prod cough with clear mucus & wheezing x2d. Sob is baseline. Denied fever, chills or sweats.  Not currently taking any over the counter meds to help with sx. Pt has not used HFA or neb solution, as she needs a Rx for neb solution. Pt using trelegy daily.   (examples of things to ask: : When did symptoms start? Fever? Cough? Productive? Color to sputum? More sputum than usual? Wheezing? Have you needed increased oxygen? Are you taking your respiratory medications? What over the counter measures have you tried?)

## 2018-12-31 NOTE — Patient Instructions (Addendum)
Will order doxycycline Will order prednisone taper May take mucinex twice daily Albuterol neb ordered  Continue trelegy Follow up with Dr. Elsworth Soho as scheduled or sooner if needed

## 2018-12-31 NOTE — Progress Notes (Signed)
@Patient  ID: Toni Moore, female    DOB: 07-Aug-1956, 63 y.o.   MRN: [id number]  Chief Complaint  Patient presents with  . Acute Visit    Possible URI. Productive cough with clear phlegm. 2 days. Chills.     Referring provider: Vicenta Aly, FNP  HPI  64 year old female with Gold IV COPD, presumed lung cancer with hypermetabolic left upper lobe nodule status post SBRT 08/2015, oxygen dependent respiratory failure with nocturnal oxygen use, new lung nodule seen in right lower lobe in November/2018   Smoker/ Smoking History: Former smoker. Quit 2015. 31.5 pack year history.  Maintenance:  Trelegy Ellipta  Pt of: Dr. Elsworth Soho  Tests:  Spirometry 05/2014 showed FEV1 of 0.63-20%, FVC 51% ,ratio of 30.  10/2014 ONO >>mild desaturation 1.5 h during sleep. Completed pulm rehab 12/2014 2L oxygen During sleep  ONO >>shows sawtooth pattern PSG 08/2016 neg for OSA  Imaging:  CT chest November 2018 showed posterior left upper lobe nodule with probable radiation fibrosis, emphysema, scattered pulmonary nodules to right lower lobe nodules are new  CT chest 2/2018Slightincrease in size of posterior left upper lobe pulmonary nodule. -by 31mm compared to 12/2015 PET 04/2017 >> decreased SUV from 4.5 to 1.5 >>>PET scan from June 2018 which showed decrease in hypermetabolic from consistent with resolving malignancy. 04/25/2018-CT chest without contrast- area of scarring and radiation change within the posterior left upper lobe nodule.  Similar to 10/17/2017 CT.  Scattered pulmonary nodules are again noted, previously referenced nodules are unchanged, there is a new cluster of 3 nodules within the periphery of the right middle lobe which measure up to 8 mm, favor postinflammatory etiology, follow-up 3 repeat CT of chest in 3 months to ensure resolution, emphysema 07/10/2018-CT chest without contrast-no significant change in appearance of treated tumor within the posterior left upper  lobe, scattered pulmonary nodules are noted throughout both lungs majority of these are stable, there is a new 1 cm nodule in the left lower lobe as well as a new right middle lobe nodule measuring 5 mm  OV 12/31/18 - acute cough Patient presents today with an acute cough.  He states his cough is nonproductive.  Symptoms started on 12/29/2018.  States that she has a sore throat with sinus congestion and postnasal drip.  She complains of generalized fatigue.  She does complain of some chills but no fever.  She was afebrile in the office today.  Requesting albuterol to be sent in for her nebulizer treatment.  She states that it was ordered at her last visit with Dr. Elsworth Soho but did not go through to the pharmacy.  Is any shortness of breath, chest pain, or edema.  Allergies  Allergen Reactions  . Asa [Aspirin] Other (See Comments)    Bleeds non-stop     Immunization History  Administered Date(s) Administered  . Influenza Split 09/29/2007, 06/28/2009, 09/28/2014  . Influenza, Quadrivalent, Recombinant, Inj, Pf 10/16/2016  . Influenza, Seasonal, Injecte, Preservative Fre 10/27/2014, 10/15/2015  . Influenza,inj,Quad PF,6+ Mos 10/27/2014, 07/18/2017, 08/14/2018  . Influenza,trivalent, recombinat, inj, PF 08/13/2012, 08/15/2013  . Influenza-Unspecified 10/15/2015  . Pneumococcal Polysaccharide-23 11/28/2005, 11/15/2016  . Tdap 01/14/2010    Past Medical History:  Diagnosis Date  . Cancer ([place])    lung ca finished radaition nov 2016  . Chronic respiratory failure ([place])   . Cirrhosis of liver ([place])    new dx 2018  . COPD (chronic obstructive pulmonary disease) ([place])   . Dyspnea    with activity, uses oxygen 2 liters  at hx  . Hoarseness   . Hyperlipidemia   . Pneumonia    last time few yrs ago  . Thrombocytopenia ([place])   . Unspecified chronic bronchitis ([place])   . Vitamin D deficiency   . Vocal cord cyst     Tobacco History: Social History   Tobacco Use  Smoking Status Former Smoker    . Packs/day: 0.75  . Years: 42.00  . Pack years: 31.50  . Types: Cigarettes  . Last attempt to quit: 07/29/2014  . Years since quitting: 4.4  Smokeless Tobacco Never Used   Counseling given: Not Answered   Outpatient Encounter Medications as of 12/31/2018  Medication Sig  . albuterol (VENTOLIN HFA) 108 (90 Base) MCG/ACT inhaler Inhale 2 puffs into the lungs every 6 (six) hours as needed (for wheezing/shortness of breath).  . Calcium Carbonate-Vitamin D3 (CALCIUM 600+D3) 600-400 MG-UNIT TABS Take 1 tablet by mouth 2 (two) times daily.  . Cholecalciferol (VITAMIN D-3) 5000 units TABS Take 5,000 Units by mouth 3 (three) times a week.   . Fluticasone-Umeclidin-Vilant (TRELEGY ELLIPTA) 100-62.5-25 MCG/INH AEPB Inhale 1 puff into the lungs daily.  . magnesium oxide (MAG-OX) 400 MG tablet Take 400 mg by mouth daily.   . OXYGEN Inhale 2 L into the lungs at bedtime.  Marland Kitchen PREVIDENT 5000 DRY MOUTH 1.1 % GEL dental gel Place 1 application onto teeth daily.  . simvastatin (ZOCOR) 20 MG tablet Take 20 mg by mouth at bedtime.   Marland Kitchen zinc gluconate 50 MG tablet Take 50 mg by mouth 2 (two) times a week. Monday & Friday nights.  Marland Kitchen albuterol (PROVENTIL) (2.5 MG/3ML) 0.083% nebulizer solution Take 3 mLs (2.5 mg total) by nebulization every 6 (six) hours as needed for wheezing or shortness of breath.  . doxycycline (VIBRA-TABS) 100 MG tablet Take 1 tablet (100 mg total) by mouth 2 (two) times daily.  . predniSONE (DELTASONE) 10 MG tablet Take 4 tabs for 2 days, then 3 tabs for 2 days, then 2 tabs for 2 days, then 1 tab for 2 days, then stop   No facility-administered encounter medications on file as of 12/31/2018.      Review of Systems  Review of Systems  Constitutional: Positive for chills. Negative for fever.  HENT: Positive for postnasal drip, sinus pressure, sinus pain and sore throat.   Respiratory: Positive for cough. Negative for shortness of breath and wheezing.   Cardiovascular: Negative.  Negative  for chest pain, palpitations and leg swelling.  Gastrointestinal: Negative.   Allergic/Immunologic: Negative.   Neurological: Negative.   Psychiatric/Behavioral: Negative.        Physical Exam  BP 110/72   Pulse (!) 118   Temp 98.5 F (36.9 C) (Oral)   Ht 5\' 8"  (1.727 m)   Wt 190 lb (86.2 kg)   SpO2 97%   BMI 28.89 kg/m   Wt Readings from Last 5 Encounters:  12/31/18 190 lb (86.2 kg)  07/17/18 186 lb 9.6 oz (84.6 kg)  06/20/18 186 lb 12.8 oz (84.7 kg)  05/15/18 189 lb 3.2 oz (85.8 kg)  05/08/18 190 lb (86.2 kg)     Physical Exam Vitals signs and nursing note reviewed.  Constitutional:      General: She is not in acute distress.    Appearance: She is well-developed.  Cardiovascular:     Rate and Rhythm: Normal rate and regular rhythm.  Pulmonary:     Effort: Pulmonary effort is normal. No respiratory distress.     Breath sounds: No  wheezing or rhonchi.  Musculoskeletal:        General: No swelling.  Neurological:     Mental Status: She is alert and oriented to person, place, and time.       Assessment & Plan:   Upper respiratory tract infection Will treat for URI.  Patient is concerned about developing pneumonia because of her past history with pneumonia.  Patient Instructions  Will order doxycycline Will order prednisone taper May take mucinex twice daily Albuterol neb ordered  Continue trelegy Follow up with Dr. Elsworth Soho as scheduled or sooner if needed       Fenton Foy, NP 12/31/2018

## 2018-12-31 NOTE — Telephone Encounter (Signed)
Please send in prescription for refill for her albuterol HFA. Have her use this for SOB or wheezing. She needs to be seen. Please schedule an acute OV. She is afebrile with clear secretions, so doubt she needs ABX. She needs to use symptomatic relief for her cold symptoms. Mucinex DM and Delsym.  Thanks

## 2019-03-12 ENCOUNTER — Ambulatory Visit: Payer: Managed Care, Other (non HMO) | Admitting: Pulmonary Disease

## 2019-07-24 ENCOUNTER — Telehealth: Payer: Self-pay | Admitting: Pulmonary Disease

## 2019-07-24 DIAGNOSIS — R911 Solitary pulmonary nodule: Secondary | ICD-10-CM

## 2019-07-24 DIAGNOSIS — J449 Chronic obstructive pulmonary disease, unspecified: Secondary | ICD-10-CM

## 2019-07-24 MED ORDER — TRELEGY ELLIPTA 100-62.5-25 MCG/INH IN AEPB: 1.0000 | INHALATION_SPRAY | Freq: Every day | RESPIRATORY_TRACT | 3 refills | Status: DC

## 2019-07-24 NOTE — Telephone Encounter (Signed)
Patient just needed Trelegy refilled. I sent to the pharmacy of patient's choice. Patient also inquiring about a follow CT supposed to be scheduled in 09/2019. Did not see an order, looked back at RA's last OV with patient and she is suppose to be scheduled with a CT in 09/2019 or order placed.  Patient then was put a recall to see Dr. Elsworth Soho in November after her CT because patient is not having any current issues.  Told patient to call us if needed anything else.   Nothing further needed.

## 2019-10-03 ENCOUNTER — Encounter (INDEPENDENT_AMBULATORY_CARE_PROVIDER_SITE_OTHER): Payer: Self-pay

## 2019-10-03 ENCOUNTER — Other Ambulatory Visit: Payer: Self-pay

## 2019-10-03 ENCOUNTER — Ambulatory Visit (INDEPENDENT_AMBULATORY_CARE_PROVIDER_SITE_OTHER)
Admission: RE | Admit: 2019-10-03 | Discharge: 2019-10-03 | Disposition: A | Discharge status: Home / Self Care | Payer: BC Managed Care – PPO | Source: Ambulatory Visit | Attending: Pulmonary Disease | Admitting: Pulmonary Disease

## 2019-10-03 DIAGNOSIS — R911 Solitary pulmonary nodule: Secondary | ICD-10-CM | POA: Diagnosis not present

## 2019-10-04 ENCOUNTER — Ambulatory Visit: Payer: BC Managed Care – PPO | Admitting: Pulmonary Disease

## 2019-10-04 ENCOUNTER — Encounter: Payer: Self-pay | Admitting: Pulmonary Disease

## 2019-10-04 DIAGNOSIS — R918 Other nonspecific abnormal finding of lung field: Secondary | ICD-10-CM

## 2019-10-04 DIAGNOSIS — J9611 Chronic respiratory failure with hypoxia: Secondary | ICD-10-CM | POA: Diagnosis not present

## 2019-10-04 DIAGNOSIS — J432 Centrilobular emphysema: Secondary | ICD-10-CM | POA: Diagnosis not present

## 2019-10-04 MED ORDER — ALBUTEROL SULFATE (2.5 MG/3ML) 0.083% IN NEBU: 2.5000 mg | INHALATION_SOLUTION | Freq: Four times a day (QID) | RESPIRATORY_TRACT | 12 refills | Status: AC | PRN
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
## 2019-10-04 NOTE — Assessment & Plan Note (Signed)
Okay to use oxygen during the day-she is using this more frequently now

## 2019-10-04 NOTE — Assessment & Plan Note (Signed)
Feel worsening dyspnea may be related to deconditioning and weight gain rather than actually worsening lung function Stay on Trelegy for now Refills on albuterol nebs to use every 4-6 hours as needed for rescue   Work on weight loss if possible-about 10 pounds. Increase activity level -okay to use elliptical

## 2019-10-04 NOTE — Assessment & Plan Note (Addendum)
CT chest without contrast in 6 months to follow-up on nodule in right upper lung In the past with pulmonary nodules have ended up being benign /inflammatory Left upper lobe scarring due to radiation

## 2019-10-04 NOTE — Patient Instructions (Signed)
Stay on Trelegy for now Refills on albuterol nebs to use every 4-6 hours as needed for rescue   Work on weight loss if possible-about 10 pounds. Increase activity level -okay to use elliptical  CT chest without contrast in 6 months to follow-up on nodule in right upper lung

## 2020-03-30 ENCOUNTER — Other Ambulatory Visit: Payer: Self-pay

## 2020-03-30 ENCOUNTER — Ambulatory Visit (INDEPENDENT_AMBULATORY_CARE_PROVIDER_SITE_OTHER)
Admission: RE | Admit: 2020-03-30 | Discharge: 2020-03-30 | Disposition: A | Discharge status: Home / Self Care | Payer: BC Managed Care – PPO | Source: Ambulatory Visit | Attending: Pulmonary Disease | Admitting: Pulmonary Disease

## 2020-03-30 DIAGNOSIS — J432 Centrilobular emphysema: Secondary | ICD-10-CM | POA: Diagnosis not present

## 2020-03-30 DIAGNOSIS — R918 Other nonspecific abnormal finding of lung field: Secondary | ICD-10-CM | POA: Diagnosis not present

## 2020-03-30 DIAGNOSIS — J9611 Chronic respiratory failure with hypoxia: Secondary | ICD-10-CM | POA: Diagnosis not present

## 2020-04-13 ENCOUNTER — Ambulatory Visit: Payer: BC Managed Care – PPO | Admitting: Pulmonary Disease

## 2020-04-16 ENCOUNTER — Other Ambulatory Visit: Payer: Self-pay

## 2020-04-16 ENCOUNTER — Encounter: Payer: Self-pay | Admitting: Pulmonary Disease

## 2020-04-16 ENCOUNTER — Ambulatory Visit (INDEPENDENT_AMBULATORY_CARE_PROVIDER_SITE_OTHER): Payer: BC Managed Care – PPO | Admitting: Pulmonary Disease

## 2020-04-16 DIAGNOSIS — J432 Centrilobular emphysema: Secondary | ICD-10-CM

## 2020-04-16 DIAGNOSIS — R918 Other nonspecific abnormal finding of lung field: Secondary | ICD-10-CM

## 2020-04-16 DIAGNOSIS — J9611 Chronic respiratory failure with hypoxia: Secondary | ICD-10-CM | POA: Diagnosis not present

## 2020-04-16 DIAGNOSIS — R911 Solitary pulmonary nodule: Secondary | ICD-10-CM

## 2020-04-16 DIAGNOSIS — J449 Chronic obstructive pulmonary disease, unspecified: Secondary | ICD-10-CM

## 2020-04-16 MED ORDER — TRELEGY ELLIPTA 100-62.5-25 MCG/INH IN AEPB: 1.0000 | INHALATION_SPRAY | Freq: Every day | RESPIRATORY_TRACT | 3 refills | Status: AC

## 2020-04-16 MED ORDER — ALBUTEROL SULFATE HFA 108 (90 BASE) MCG/ACT IN AERS: 2.0000 | INHALATION_SPRAY | Freq: Four times a day (QID) | RESPIRATORY_TRACT | 1 refills | Status: DC | PRN

## 2020-04-16 NOTE — Assessment & Plan Note (Signed)
Continue oxygen at all times. Emphasized Covid vaccination, Long discussion about this, she does not want this

## 2020-04-16 NOTE — Patient Instructions (Signed)
Refills on Trelegy. Prescription for albuterol MDI 1-2 puffs every 8 hours only as needed  Repeat CT chest without contrast in November
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
## 2020-04-16 NOTE — Assessment & Plan Note (Signed)
6 m FU CT chest without contrast in November Favor benign nature given fleeting appearance of nodules on prior CTs

## 2020-04-16 NOTE — Assessment & Plan Note (Signed)
Refills on Trelegy. Prescription for albuterol MDI 1-2 puffs every 8 hours only as needed

## 2020-06-24 ENCOUNTER — Other Ambulatory Visit: Payer: Self-pay | Admitting: Pulmonary Disease

## 2020-06-24 DIAGNOSIS — J449 Chronic obstructive pulmonary disease, unspecified: Secondary | ICD-10-CM

## 2020-07-09 ENCOUNTER — Telehealth: Payer: Self-pay | Admitting: Pulmonary Disease

## 2020-07-09 DIAGNOSIS — J432 Centrilobular emphysema: Secondary | ICD-10-CM

## 2020-07-09 NOTE — Telephone Encounter (Signed)
Ok to order 

## 2020-07-09 NOTE — Telephone Encounter (Signed)
Patient requesting order for oxygen equipment. Please advise if triage can order.  Patient would like order for home oxygen concentrator. Patient would like to [facility]. Phone number is [phone]. Patient phone number is [phone].

## 2020-07-09 NOTE — Telephone Encounter (Signed)
Called and spoke to pt. Informed her of RA's approval. Order placed for new concentrator. This concentrator will be the patient's second concentrator within the home and is aware she will need to pay out of pocket. Nothing further needed at this time. Will sign off.

## 2020-09-25 ENCOUNTER — Telehealth: Payer: Self-pay | Admitting: Pulmonary Disease

## 2020-09-25 NOTE — Telephone Encounter (Signed)
Mia RN returned phone call. Mia RN made aware of what Dr Elsworth Soho wrote/recommends. Mia RN expressed understanding and requesting a printed version of what Dr Elsworth Soho stated. Printed out what Dr Elsworth Soho wrote and Dr Elsworth Soho signed it and it was successfully faxed to [facility] (Attention Mia RN).

## 2020-09-25 NOTE — Telephone Encounter (Signed)
Received clearance request for epigastric hernia repair on 11/5 from [facility] surgical associates. She has severe COPD and chronic hypoxic respiratory failure on oxygen 24/7.  She would be at moderate to high risk for postop pulmonary Complications

## 2020-09-25 NOTE — Telephone Encounter (Signed)
Called and left message on voicemail for Mia RN for Dr Tally Due from [place] in regards to surgical clearance for this patient and asked Mia to please return phone call. Contact number provided.

## 2020-10-01 ENCOUNTER — Inpatient Hospital Stay: Admission: RE | Admit: 2020-10-01 | Payer: BC Managed Care – PPO | Source: Ambulatory Visit

## 2020-10-08 ENCOUNTER — Inpatient Hospital Stay: Admission: RE | Admit: 2020-10-08 | Payer: BC Managed Care – PPO | Source: Ambulatory Visit

## 2020-10-12 ENCOUNTER — Telehealth: Payer: Self-pay | Admitting: Pulmonary Disease

## 2020-10-12 ENCOUNTER — Ambulatory Visit (INDEPENDENT_AMBULATORY_CARE_PROVIDER_SITE_OTHER)
Admission: RE | Admit: 2020-10-12 | Discharge: 2020-10-12 | Disposition: A | Discharge status: Home / Self Care | Payer: BC Managed Care – PPO | Source: Ambulatory Visit | Attending: Pulmonary Disease | Admitting: Pulmonary Disease

## 2020-10-12 ENCOUNTER — Other Ambulatory Visit: Payer: Self-pay

## 2020-10-12 DIAGNOSIS — R918 Other nonspecific abnormal finding of lung field: Secondary | ICD-10-CM | POA: Diagnosis not present

## 2020-10-12 DIAGNOSIS — R911 Solitary pulmonary nodule: Secondary | ICD-10-CM

## 2020-10-12 NOTE — Telephone Encounter (Signed)
Call report from Opal Sidles at [facility] :   IMPRESSION: 1. Two new areas of irregular nodular consolidation along the minor fissure and in the right lower lobe. Findings may be infectious/inflammatory or malignant in etiology. Consider initial short-term follow-up CT chest without contrast in 4-6 weeks in further initial evaluation, as clinically indicated. These results will be called to the ordering clinician or representative by the Radiologist Assistant, and communication documented in the PACS or [employer]. 2. Additional scattered pulmonary nodules are stable for 1 year, indicative of benign lesions. 3. Cirrhosis. 4. Aortic atherosclerosis (ICD10-I70.0). Coronary artery calcification. 5.  Emphysema (ICD10-J43.9).

## 2020-10-12 NOTE — Telephone Encounter (Signed)
Old nodules are unchanged. 2 new nodules are noted 5 and 7 mm. Given her previous history of fleeting nodules that come and go, I am not worried about these. Would recommend follow-up CT chest in 6 months I will discuss more during her follow-up visit

## 2020-10-15 NOTE — Telephone Encounter (Signed)
Called and went over CT results per Dr Elsworth Soho with patient. All questions answered and patient expressed full understanding. Patient agreeable to follow up CT in 6 months. Order placed per Dr Elsworth Soho. Confirmed scheduled office visit with Dr Elsworth Soho for 10/30/2020 @ 1145am. Patient agreeable to time, date and location. Nothing further needed at this time.

## 2020-10-30 ENCOUNTER — Encounter: Payer: Self-pay | Admitting: Pulmonary Disease

## 2020-10-30 ENCOUNTER — Other Ambulatory Visit: Payer: Self-pay

## 2020-10-30 ENCOUNTER — Ambulatory Visit (INDEPENDENT_AMBULATORY_CARE_PROVIDER_SITE_OTHER): Payer: BC Managed Care – PPO | Admitting: Pulmonary Disease

## 2020-10-30 DIAGNOSIS — J432 Centrilobular emphysema: Secondary | ICD-10-CM

## 2020-10-30 DIAGNOSIS — J9611 Chronic respiratory failure with hypoxia: Secondary | ICD-10-CM

## 2020-10-30 DIAGNOSIS — C3412 Malignant neoplasm of upper lobe, left bronchus or lung: Secondary | ICD-10-CM | POA: Diagnosis not present

## 2020-10-30 NOTE — Patient Instructions (Signed)
CT chest WO contrast in may 2022 Continue trelegy
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
## 2020-10-30 NOTE — Assessment & Plan Note (Signed)
Status post SBRT. She has 2 new nodules in the right lung.  She has a history of fleeting nodules that come and go.  We will obtain a 65-month follow-up CT in May 2022

## 2020-10-30 NOTE — Assessment & Plan Note (Signed)
She will continue 2 to 3 L oxygen on exertion. Given her very poor lung function, she would be moderate to high risk for epigastric hernia repair.  This has been conveyed to Arthur

## 2020-10-30 NOTE — Assessment & Plan Note (Signed)
She does not seem to be having an exacerbation, however oxygenation is slightly decreased descending to 86% on walking on arrival. Exam does not show any bronchospasm. She will continue on her Trelegy and albuterol as needed.  If she develops cough or wheezing she will call and we can provide her prednisone and antibiotic

## 2020-12-07 ENCOUNTER — Other Ambulatory Visit: Payer: Self-pay | Admitting: Pulmonary Disease

## 2020-12-07 DIAGNOSIS — J449 Chronic obstructive pulmonary disease, unspecified: Secondary | ICD-10-CM

## 2021-04-19 ENCOUNTER — Other Ambulatory Visit: Payer: Self-pay

## 2021-04-19 ENCOUNTER — Ambulatory Visit (INDEPENDENT_AMBULATORY_CARE_PROVIDER_SITE_OTHER)
Admission: RE | Admit: 2021-04-19 | Discharge: 2021-04-19 | Disposition: A | Discharge status: Home / Self Care | Payer: Medicare Other | Source: Ambulatory Visit | Attending: Pulmonary Disease | Admitting: Pulmonary Disease

## 2021-04-19 DIAGNOSIS — R918 Other nonspecific abnormal finding of lung field: Secondary | ICD-10-CM

## 2021-04-22 ENCOUNTER — Other Ambulatory Visit: Payer: Self-pay

## 2021-04-22 DIAGNOSIS — C3412 Malignant neoplasm of upper lobe, left bronchus or lung: Secondary | ICD-10-CM

## 2021-04-22 NOTE — Progress Notes (Signed)
Called and went over CT results per Dr Elsworth Soho with patient. All questions answered and patient expressed full understanding of results and agreeable with Dr Bari Mantis recommendations. Order placed for CT without contrast in 3 months per Dr Elsworth Soho. Scheduled office at [facility] with with NP for Thursday 05/20/21 per patient 1st available. Patient agreeable with time, date and location. Will route to Dr Elsworth Soho as Juluis Rainier. Nothing further needed at this time.

## 2021-05-20 ENCOUNTER — Other Ambulatory Visit: Payer: Self-pay

## 2021-05-20 ENCOUNTER — Encounter: Payer: Self-pay | Admitting: Adult Health

## 2021-05-20 ENCOUNTER — Ambulatory Visit (INDEPENDENT_AMBULATORY_CARE_PROVIDER_SITE_OTHER): Payer: BC Managed Care – PPO | Admitting: Adult Health

## 2021-05-20 DIAGNOSIS — J449 Chronic obstructive pulmonary disease, unspecified: Secondary | ICD-10-CM | POA: Diagnosis not present

## 2021-05-20 DIAGNOSIS — R918 Other nonspecific abnormal finding of lung field: Secondary | ICD-10-CM | POA: Diagnosis not present

## 2021-05-20 DIAGNOSIS — J9611 Chronic respiratory failure with hypoxia: Secondary | ICD-10-CM

## 2021-05-20 NOTE — Assessment & Plan Note (Signed)
Very severe COPD currently at baseline on present regimen.  Continue on triple therapy  Plan  Patient Instructions  Continue on TRELEGY 1 puff daily , rinse after use.  Continue on Oxygen 2l/m .  Follow up for CT chest as planned in August 2022  Follow up Dr. Elsworth Soho  In 3 months and As needed

## 2021-05-20 NOTE — Patient Instructions (Signed)
Continue on TRELEGY 1 puff daily , rinse after use.  Continue on Oxygen 2l/m .  Follow up for CT chest as planned in August 2022  Follow up Dr. Elsworth Soho  In 3 months and As needed
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
## 2021-05-20 NOTE — Assessment & Plan Note (Signed)
Continue on O2 to maintain O2 saturations greater than 88 to 90%.

## 2021-05-20 NOTE — Assessment & Plan Note (Signed)
Most recent CT chest showed new lung nodules in the right lower lobe and right upper lobe.  Follow-up CT in 3 months has been planned.

## 2021-07-15 ENCOUNTER — Other Ambulatory Visit: Payer: Medicare Other
